# Patient Record
Sex: Male | Born: 1995 | Race: White | Hispanic: No | Marital: Single | Smoking: Never smoker
Health system: Southern US, Community
[De-identification: ages and names within clinical notes are randomized; demographics above are authoritative.]

## PROBLEM LIST (undated history)

## (undated) DIAGNOSIS — F840 Autistic disorder: Secondary | ICD-10-CM

## (undated) DIAGNOSIS — L709 Acne, unspecified: Secondary | ICD-10-CM

## (undated) DIAGNOSIS — Z8489 Family history of other specified conditions: Secondary | ICD-10-CM

## (undated) DIAGNOSIS — F988 Other specified behavioral and emotional disorders with onset usually occurring in childhood and adolescence: Secondary | ICD-10-CM

## (undated) DIAGNOSIS — Z0000 Encounter for general adult medical examination without abnormal findings: Secondary | ICD-10-CM

## (undated) DIAGNOSIS — K219 Gastro-esophageal reflux disease without esophagitis: Secondary | ICD-10-CM

## (undated) HISTORY — PX: ADENOIDECTOMY AND MYRINGOTOMY WITH TUBE PLACEMENT: SHX5714

## (undated) HISTORY — DX: Autistic disorder: F84.0

## (undated) HISTORY — PX: WISDOM TOOTH EXTRACTION: SHX21

## (undated) HISTORY — DX: Encounter for general adult medical examination without abnormal findings: Z00.00

## (undated) HISTORY — DX: Other specified behavioral and emotional disorders with onset usually occurring in childhood and adolescence: F98.8

## (undated) HISTORY — DX: Acne, unspecified: L70.9

---
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
## 2016-06-13 ENCOUNTER — Encounter: Payer: BLUE CROSS/BLUE SHIELD | Admitting: Family Medicine

## 2017-08-14 ENCOUNTER — Encounter: Payer: Self-pay | Admitting: Family Medicine

## 2017-08-14 ENCOUNTER — Ambulatory Visit (INDEPENDENT_AMBULATORY_CARE_PROVIDER_SITE_OTHER): Payer: BLUE CROSS/BLUE SHIELD | Admitting: Family Medicine

## 2017-08-14 DIAGNOSIS — Z0000 Encounter for general adult medical examination without abnormal findings: Secondary | ICD-10-CM | POA: Diagnosis not present

## 2017-08-14 NOTE — Patient Instructions (Signed)
Tylenol  ES 500 mg 1 tab twice daily Aleve/Naproxen or Advil/Motrin/Ibuprofen take 1 daily with food  Hydrate with 64  Preventive Care 18-39 Years, Male Preventive care refers to lifestyle choices and visits with your health care provider that can promote health and wellness. What does preventive care include?  A yearly physical exam. This is also called an annual well check.  Dental exams once or twice a year.  Routine eye exams. Ask your health care provider how often you should have your eyes checked.  Personal lifestyle choices, including: ? Daily care of your teeth and gums. ? Regular physical activity. ? Eating a healthy diet. ? Avoiding tobacco and drug use. ? Limiting alcohol use. ? Practicing safe sex. What happens during an annual well check? The services and screenings done by your health care provider during your annual well check will depend on your age, overall health, lifestyle risk factors, and family history of disease. Counseling Your health care provider may ask you questions about your:  Alcohol use.  Tobacco use.  Drug use.  Emotional well-being.  Home and relationship well-being.  Sexual activity.  Eating habits.  Work and work Statistician.  Screening You may have the following tests or measurements:  Height, weight, and BMI.  Blood pressure.  Lipid and cholesterol levels. These may be checked every 5 years starting at age 34.  Diabetes screening. This is done by checking your blood sugar (glucose) after you have not eaten for a while (fasting).  Skin check.  Hepatitis C blood test.  Hepatitis B blood test.  Sexually transmitted disease (STD) testing.  Discuss your test results, treatment options, and if necessary, the need for more tests with your health care provider. Vaccines Your health care provider may recommend certain vaccines, such as:  Influenza vaccine. This is recommended every year.  Tetanus, diphtheria, and  acellular pertussis (Tdap, Td) vaccine. You may need a Td booster every 10 years.  Varicella vaccine. You may need this if you have not been vaccinated.  HPV vaccine. If you are 37 or younger, you may need three doses over 6 months.  Measles, mumps, and rubella (MMR) vaccine. You may need at least one dose of MMR.You may also need a second dose.  Pneumococcal 13-valent conjugate (PCV13) vaccine. You may need this if you have certain conditions and have not been vaccinated.  Pneumococcal polysaccharide (PPSV23) vaccine. You may need one or two doses if you smoke cigarettes or if you have certain conditions.  Meningococcal vaccine. One dose is recommended if you are age 74-21 years and a first-year college student living in a residence hall, or if you have one of several medical conditions. You may also need additional booster doses.  Hepatitis A vaccine. You may need this if you have certain conditions or if you travel or work in places where you may be exposed to hepatitis A.  Hepatitis B vaccine. You may need this if you have certain conditions or if you travel or work in places where you may be exposed to hepatitis B.  Haemophilus influenzae type b (Hib) vaccine. You may need this if you have certain risk factors.  Talk to your health care provider about which screenings and vaccines you need and how often you need them. This information is not intended to replace advice given to you by your health care provider. Make sure you discuss any questions you have with your health care provider. Document Released: 12/09/2001 Document Revised: 07/02/2016 Document Reviewed: 08/14/2015 Elsevier  Interactive Patient Education  2017 [place]. oz of clear fluids daily

## 2017-08-14 NOTE — Assessment & Plan Note (Addendum)
Patient encouraged to maintain heart healthy diet, regular exercise, adequate sleep. Consider daily probiotics. Take medications as prescribed. 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 Eats well on a good day. Sleeping 7.5 hours most nights and is working well

## 2017-08-14 NOTE — Progress Notes (Signed)
Subjective:  I acted as a Education administrator for Dr. Charlett Blake. [place], [place]  Patient ID: Rickey [name], male    DOB: 07-Apr-1996, 21 y.o.   MRN: [id number]  No chief complaint on file.   HPI  Patient is in today for an annual exam and he reports he feels well. No recent febrile illness or hospitalizations. He continues to work and go to school. Is not always eating a heart healthy diet. Stays active without regular exercise. Doing well with ADLs.  Denies CP/palp/SOB/HA/congestion/fevers/GI or GU c/o. Taking meds as prescribed  Patient Care Team: Mosie Lukes, MD as PCP - General (Family Medicine) Deirdre Pippins, PA-C as Physician Assistant (Internal Medicine)   Past Medical History:  Diagnosis Date  . Acne   . ADD (attention deficit disorder)   . Autism spectrum disorder 06/24/2015  . Autistic disorder   . Preventative health care 06/24/2015    Past Surgical History:  Procedure Laterality Date  . ADENOIDECTOMY AND MYRINGOTOMY WITH TUBE PLACEMENT      Family History  Problem Relation Age of Onset  . Diabetes Father   . Kidney disease Brother        kidney stones  . Diabetes Paternal Grandmother   . Heart disease Paternal Grandmother   . Diabetes Paternal Grandfather   . Heart disease Paternal Grandfather     Social History   Social History  . Marital status: Single    Spouse name: N/A  . Number of children: N/A  . Years of education: N/A   Occupational History  . student at [employer] History Main Topics  . Smoking status: Never Smoker  . Smokeless tobacco: Never Used  . Alcohol use No  . Drug use: No  . Sexual activity: No     Comment: lives with parents and brother,   Other Topics Concern  . Not on file   Social History Narrative  . No narrative on file    Outpatient Medications Prior to Visit  Medication Sig Dispense Refill  . Adapalene-Benzoyl Peroxide (EPIDUO FORTE) 0.3-2.5 % GEL Apply topically at bedtime.    . Naproxen Sodium (ALEVE  PO) Take by mouth as needed.    . sulfamethoxazole-trimethoprim (BACTRIM DS,SEPTRA DS) 800-160 MG per tablet Take 1 tablet by mouth 2 (two) times daily.     No facility-administered medications prior to visit.     No Known Allergies  Review of Systems  Constitutional: Negative for fever and malaise/fatigue.  HENT: Negative for congestion.   Eyes: Negative for blurred vision.  Respiratory: Negative for cough and shortness of breath.   Cardiovascular: Negative for chest pain, palpitations and leg swelling.  Gastrointestinal: Negative for vomiting.  Musculoskeletal: Negative for back pain.  Skin: Negative for rash.  Neurological: Negative for loss of consciousness and headaches.       Objective:    Physical Exam  Constitutional: He is oriented to person, place, and time. He appears well-developed and well-nourished. No distress.  HENT:  Head: Normocephalic and atraumatic.  Eyes: Conjunctivae are normal.  Neck: Normal range of motion. No thyromegaly present.  Cardiovascular: Normal rate and regular rhythm.   Pulmonary/Chest: Effort normal and breath sounds normal. He has no wheezes.  Abdominal: Soft. Bowel sounds are normal. There is no tenderness.  Musculoskeletal: Normal range of motion. He exhibits no edema or deformity.  Neurological: He is alert and oriented to person, place, and time.  Skin: Skin is warm and dry. He is not diaphoretic.  Psychiatric: He has a normal mood and affect.    BP 120/72 (BP Location: Left Arm, Patient Position: Sitting, Cuff Size: Normal)   Pulse 75   Temp 97.9 F (36.6 C) (Oral)   Resp 18   Ht 6' 1.25" (1.861 m)   Wt 175 lb 3.2 oz (79.5 kg)   BMI 22.96 kg/m  Wt Readings from Last 3 Encounters:  08/14/17 175 lb 3.2 oz (79.5 kg)  06/12/15 183 lb 6 oz (83.2 kg) (87 %, Z= 1.14)*   * Growth percentiles are based on CDC 2-20 Years data.   BP Readings from Last 3 Encounters:  08/14/17 120/72  06/12/15 108/68     Immunization History    Administered Date(s) Administered  . Influenza-Unspecified 07/27/2014    Health Maintenance  Topic Date Due  . HIV Screening  02/26/2012  . TETANUS/TDAP  02/26/2016  . INFLUENZA VACCINE  01/24/2018 (Originally 05/27/2017)    No results found for: WBC, HGB, HCT, PLT, GLUCOSE, CHOL, TRIG, HDL, LDLDIRECT, LDLCALC, ALT, AST, NA, K, CL, CREATININE, BUN, CO2, TSH, PSA, INR, GLUF, HGBA1C, MICROALBUR  No results found for: TSH No results found for: WBC, HGB, HCT, MCV, PLT No results found for: NA, K, CHLORIDE, CO2, GLUCOSE, BUN, CREATININE, BILITOT, ALKPHOS, AST, ALT, PROT, ALBUMIN, CALCIUM, ANIONGAP, EGFR, GFR No results found for: CHOL No results found for: HDL No results found for: LDLCALC No results found for: TRIG No results found for: CHOLHDL No results found for: HGBA1C       Assessment & Plan:   Problem List Items Addressed This Visit    Preventative health care    Patient encouraged to maintain heart healthy diet, regular exercise, adequate sleep. Consider daily probiotics. Take medications as prescribed. 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 Eats well on a good day. Sleeping 7.5 hours most nights and is working well         I have discontinued Mr. Fleites's Adapalene-Benzoyl Peroxide, sulfamethoxazole-trimethoprim, and Naproxen Sodium (ALEVE PO).  No orders of the defined types were placed in this encounter.   CMA served as Education administrator during this visit. History, Physical and Plan performed by medical provider. Documentation and orders reviewed and attested to.  Penni Homans, MD

## 2018-08-20 ENCOUNTER — Ambulatory Visit (INDEPENDENT_AMBULATORY_CARE_PROVIDER_SITE_OTHER): Payer: BLUE CROSS/BLUE SHIELD | Admitting: Family Medicine

## 2018-08-20 ENCOUNTER — Encounter: Payer: Self-pay | Admitting: Family Medicine

## 2018-08-20 DIAGNOSIS — Z23 Encounter for immunization: Secondary | ICD-10-CM

## 2018-08-20 DIAGNOSIS — Z0000 Encounter for general adult medical examination without abnormal findings: Secondary | ICD-10-CM

## 2018-08-20 NOTE — Patient Instructions (Signed)
Afrin nasal spray for flying and sleeping   Zyrtec and Flonase and nasal saline as needed  Vitamin C, Elderberry, Zinc for any any respiratory illness.   Lidocaine gel by Aspercreme, Icy Hot, [place]on [place]  Preventive Care 18-39 Years, Male Preventive care refers to lifestyle choices and visits with your health care provider that can promote health and wellness. What does preventive care include?  A yearly physical exam. This is also called an annual well check.  Dental exams once or twice a year.  Routine eye exams. Ask your health care provider how often you should have your eyes checked.  Personal lifestyle choices, including: ? Daily care of your teeth and gums. ? Regular physical activity. ? Eating a healthy diet. ? Avoiding tobacco and drug use. ? Limiting alcohol use. ? Practicing safe sex. What happens during an annual well check? The services and screenings done by your health care provider during your annual well check will depend on your age, overall health, lifestyle risk factors, and family history of disease. Counseling Your health care provider may ask you questions about your:  Alcohol use.  Tobacco use.  Drug use.  Emotional well-being.  Home and relationship well-being.  Sexual activity.  Eating habits.  Work and work Statistician.  Screening You may have the following tests or measurements:  Height, weight, and BMI.  Blood pressure.  Lipid and cholesterol levels. These may be checked every 5 years starting at age 98.  Diabetes screening. This is done by checking your blood sugar (glucose) after you have not eaten for a while (fasting).  Skin check.  Hepatitis C blood test.  Hepatitis B blood test.  Sexually transmitted disease (STD) testing.  Discuss your test results, treatment options, and if necessary, the need for more tests with your health care provider. Vaccines Your health care provider may recommend certain vaccines, such  as:  Influenza vaccine. This is recommended every year.  Tetanus, diphtheria, and acellular pertussis (Tdap, Td) vaccine. You may need a Td booster every 10 years.  Varicella vaccine. You may need this if you have not been vaccinated.  HPV vaccine. If you are 43 or younger, you may need three doses over 6 months.  Measles, mumps, and rubella (MMR) vaccine. You may need at least one dose of MMR.You may also need a second dose.  Pneumococcal 13-valent conjugate (PCV13) vaccine. You may need this if you have certain conditions and have not been vaccinated.  Pneumococcal polysaccharide (PPSV23) vaccine. You may need one or two doses if you smoke cigarettes or if you have certain conditions.  Meningococcal vaccine. One dose is recommended if you are age 98-21 years and a first-year college student living in a residence hall, or if you have one of several medical conditions. You may also need additional booster doses.  Hepatitis A vaccine. You may need this if you have certain conditions or if you travel or work in places where you may be exposed to hepatitis A.  Hepatitis B vaccine. You may need this if you have certain conditions or if you travel or work in places where you may be exposed to hepatitis B.  Haemophilus influenzae type b (Hib) vaccine. You may need this if you have certain risk factors.  Talk to your health care provider about which screenings and vaccines you need and how often you need them. This information is not intended to replace advice given to you by your health care provider. Make sure you discuss any questions you  have with your health care provider. Document Released: 12/09/2001 Document Revised: 07/02/2016 Document Reviewed: 08/14/2015 Elsevier Interactive Patient Education  2018 [place] 18-39 Years, Male Preventive care refers to lifestyle choices and visits with your health care provider that can promote health and wellness. What  does preventive care include?  A yearly physical exam. This is also called an annual well check.  Dental exams once or twice a year.  Routine eye exams. Ask your health care provider how often you should have your eyes checked.  Personal lifestyle choices, including: ? Daily care of your teeth and gums. ? Regular physical activity. ? Eating a healthy diet. ? Avoiding tobacco and drug use. ? Limiting alcohol use. ? Practicing safe sex. What happens during an annual well check? The services and screenings done by your health care provider during your annual well check will depend on your age, overall health, lifestyle risk factors, and family history of disease. Counseling Your health care provider may ask you questions about your:  Alcohol use.  Tobacco use.  Drug use.  Emotional well-being.  Home and relationship well-being.  Sexual activity.  Eating habits.  Work and work Statistician.  Screening You may have the following tests or measurements:  Height, weight, and BMI.  Blood pressure.  Lipid and cholesterol levels. These may be checked every 5 years starting at age 22.  Diabetes screening. This is done by checking your blood sugar (glucose) after you have not eaten for a while (fasting).  Skin check.  Hepatitis C blood test.  Hepatitis B blood test.  Sexually transmitted disease (STD) testing.  Discuss your test results, treatment options, and if necessary, the need for more tests with your health care provider. Vaccines Your health care provider may recommend certain vaccines, such as:  Influenza vaccine. This is recommended every year.  Tetanus, diphtheria, and acellular pertussis (Tdap, Td) vaccine. You may need a Td booster every 10 years.  Varicella vaccine. You may need this if you have not been vaccinated.  HPV vaccine. If you are 63 or younger, you may need three doses over 6 months.  Measles, mumps, and rubella (MMR) vaccine. You may need  at least one dose of MMR.You may also need a second dose.  Pneumococcal 13-valent conjugate (PCV13) vaccine. You may need this if you have certain conditions and have not been vaccinated.  Pneumococcal polysaccharide (PPSV23) vaccine. You may need one or two doses if you smoke cigarettes or if you have certain conditions.  Meningococcal vaccine. One dose is recommended if you are age 16-21 years and a first-year college student living in a residence hall, or if you have one of several medical conditions. You may also need additional booster doses.  Hepatitis A vaccine. You may need this if you have certain conditions or if you travel or work in places where you may be exposed to hepatitis A.  Hepatitis B vaccine. You may need this if you have certain conditions or if you travel or work in places where you may be exposed to hepatitis B.  Haemophilus influenzae type b (Hib) vaccine. You may need this if you have certain risk factors.  Talk to your health care provider about which screenings and vaccines you need and how often you need them. This information is not intended to replace advice given to you by your health care provider. Make sure you discuss any questions you have with your health care provider. Document Released: 12/09/2001 Document Revised:  07/02/2016 Document Reviewed: 08/14/2015 Elsevier Interactive Patient Education  [employer].

## 2018-08-20 NOTE — Assessment & Plan Note (Signed)
Patient encouraged to maintain heart healthy diet, regular exercise, adequate sleep. Consider daily probiotics. Take medications as prescribed. Given and reviewed copy of ACP documents from [place] Secretary of State and encouraged to complete and return 

## 2018-08-20 NOTE — Progress Notes (Signed)
Subjective:    Patient ID: Rickey [name], male    DOB: April 25, 1996, 22 y.o.   MRN: [id number]  No chief complaint on file.   HPI Patient is in today for annual preventative exam. He feels well today. No recent febrile illness or hospitalizations. He is eating well and trying to maintain a heart healthy diet. He stays active and works full time while going to school. No difficulties with activities of daily living. Denies CP/palp/SOB/HA/congestion/fevers/GI or GU c/o. Taking meds as prescribed  Past Medical History:  Diagnosis Date  . Acne   . ADD (attention deficit disorder)   . Autism spectrum disorder 06/24/2015  . Autistic disorder   . Preventative health care 06/24/2015    Past Surgical History:  Procedure Laterality Date  . ADENOIDECTOMY AND MYRINGOTOMY WITH TUBE PLACEMENT      Family History  Problem Relation Age of Onset  . Diabetes Father   . Kidney disease Brother        kidney stones  . Diabetes Paternal Grandmother   . Heart disease Paternal Grandmother   . Diabetes Paternal Grandfather   . Heart disease Paternal Grandfather     Social History   Socioeconomic History  . Marital status: Single    Spouse name: Not on file  . Number of children: Not on file  . Years of education: Not on file  . Highest education level: Not on file  Occupational History  . Occupation: Ship broker at [employer]  . Financial resource strain: Not on file  . Food insecurity:    Worry: Not on file    Inability: Not on file  . Transportation needs:    Medical: Not on file    Non-medical: Not on file  Tobacco Use  . Smoking status: Never Smoker  . Smokeless tobacco: Never Used  Substance and Sexual Activity  . Alcohol use: No    Alcohol/week: 0.0 standard drinks  . Drug use: No  . Sexual activity: Never    Comment: lives with parents and brother,  Lifestyle  . Physical activity:    Days per week: Not on file    Minutes per session: Not on file  .  Stress: Not on file  Relationships  . Social connections:    Talks on phone: Not on file    Gets together: Not on file    Attends religious service: Not on file    Active member of club or organization: Not on file    Attends meetings of clubs or organizations: Not on file    Relationship status: Not on file  . Intimate partner violence:    Fear of current or ex partner: Not on file    Emotionally abused: Not on file    Physically abused: Not on file    Forced sexual activity: Not on file  Other Topics Concern  . Not on file  Social History Narrative  . Not on file    No outpatient medications prior to visit.   No facility-administered medications prior to visit.     No Known Allergies  Review of Systems  Constitutional: Negative for chills, fever and malaise/fatigue.  HENT: Negative for congestion and hearing loss.   Eyes: Negative for discharge.  Respiratory: Negative for cough, sputum production and shortness of breath.   Cardiovascular: Negative for chest pain, palpitations and leg swelling.  Gastrointestinal: Negative for abdominal pain, blood in stool, constipation, diarrhea, heartburn, nausea and vomiting.  Genitourinary: Negative for dysuria,  frequency, hematuria and urgency.  Musculoskeletal: Negative for back pain, falls and myalgias.  Skin: Negative for rash.  Neurological: Negative for dizziness, sensory change, loss of consciousness, weakness and headaches.  Endo/Heme/Allergies: Negative for environmental allergies. Does not bruise/bleed easily.  Psychiatric/Behavioral: Negative for depression and suicidal ideas. The patient is not nervous/anxious and does not have insomnia.        Objective:    Physical Exam  Constitutional: He is oriented to person, place, and time. He appears well-developed and well-nourished. No distress.  HENT:  Head: Normocephalic and atraumatic.  Nose: Nose normal.  Eyes: Right eye exhibits no discharge. Left eye exhibits no  discharge.  Neck: Normal range of motion. Neck supple.  Cardiovascular: Normal rate and regular rhythm.  No murmur heard. Pulmonary/Chest: Effort normal and breath sounds normal.  Abdominal: Soft. Bowel sounds are normal. There is no tenderness.  Musculoskeletal: He exhibits no edema.  Neurological: He is alert and oriented to person, place, and time.  Skin: Skin is warm and dry.  Psychiatric: He has a normal mood and affect.  Nursing note and vitals reviewed.   There were no vitals taken for this visit. Wt Readings from Last 3 Encounters:  08/14/17 175 lb 3.2 oz (79.5 kg)  06/12/15 183 lb 6 oz (83.2 kg) (87 %, Z= 1.14)*   * Growth percentiles are based on CDC (Boys, 2-20 Years) data.     No results found for: WBC, HGB, HCT, PLT, GLUCOSE, CHOL, TRIG, HDL, LDLDIRECT, LDLCALC, ALT, AST, NA, K, CL, CREATININE, BUN, CO2, TSH, PSA, INR, GLUF, HGBA1C, MICROALBUR  No results found for: TSH No results found for: WBC, HGB, HCT, MCV, PLT No results found for: NA, K, CHLORIDE, CO2, GLUCOSE, BUN, CREATININE, BILITOT, ALKPHOS, AST, ALT, PROT, ALBUMIN, CALCIUM, ANIONGAP, EGFR, GFR No results found for: CHOL No results found for: HDL No results found for: LDLCALC No results found for: TRIG No results found for: CHOLHDL No results found for: HGBA1C     Assessment & Plan:   Problem List Items Addressed This Visit    Preventative health care    Patient encouraged to maintain heart healthy diet, regular exercise, adequate sleep. Consider daily probiotics. Take medications as prescribed. Given and reviewed copy of ACP documents from [employer] and encouraged to complete and return.         Bennie Dallas does not currently have medications on file.  No orders of the defined types were placed in this encounter.    Penni Homans, MD

## 2019-08-25 ENCOUNTER — Encounter: Payer: BLUE CROSS/BLUE SHIELD | Admitting: Family Medicine

## 2020-08-05 DIAGNOSIS — K219 Gastro-esophageal reflux disease without esophagitis: Secondary | ICD-10-CM | POA: Diagnosis not present

## 2020-08-13 ENCOUNTER — Other Ambulatory Visit: Payer: Self-pay

## 2020-08-13 ENCOUNTER — Ambulatory Visit (INDEPENDENT_AMBULATORY_CARE_PROVIDER_SITE_OTHER): Payer: BC Managed Care – PPO | Admitting: Medical

## 2020-08-13 VITALS — BP 150/92 | HR 110 | Temp 98.5°F | Resp 20 | Ht 72.0 in | Wt 205.6 lb

## 2020-08-13 DIAGNOSIS — M549 Dorsalgia, unspecified: Secondary | ICD-10-CM | POA: Diagnosis not present

## 2020-08-13 DIAGNOSIS — K219 Gastro-esophageal reflux disease without esophagitis: Secondary | ICD-10-CM

## 2020-08-13 DIAGNOSIS — R1011 Right upper quadrant pain: Secondary | ICD-10-CM

## 2020-08-13 LAB — POC URINALSYSI DIPSTICK (AUTOMATED)
Bilirubin, UA: NEGATIVE
Blood, UA: NEGATIVE
Glucose, UA: NEGATIVE
Ketones, UA: 5
Leukocytes, UA: NEGATIVE
Nitrite, UA: NEGATIVE
Protein, UA: POSITIVE — AB
Spec Grav, UA: 1.015 (ref 1.010–1.025)
Urobilinogen, UA: 0.2 E.U./dL
pH, UA: 7 (ref 5.0–8.0)

## 2020-08-13 LAB — CBC WITH DIFFERENTIAL/PLATELET
Absolute Monocytes: 540 cells/uL (ref 200–950)
Platelets: 224 10*3/uL (ref 140–400)
RBC: 5.46 10*6/uL (ref 4.20–5.80)

## 2020-08-13 NOTE — Patient Instructions (Addendum)
For your recent gerd/heart burn continue omeprazole and healthy diet. Avoid fried foods and greasy foods. Stop cheer wine and avoid alcohol.  Get labs today cbc, cmp and lipase.  Go down stairs and get scheduled for [place] of abdomen.   Urine does not show blood or infection. So I am not doing imaging studies/not xray needed presently.  If pain recurrent in back along with upper rt side abdomen pain let us know.  Follow up in 7 days or as needed.

## 2020-08-13 NOTE — Progress Notes (Signed)
Subjective:    Patient ID: Rickey [name], male    DOB: 1996-05-20, 24 y.o.   MRN: [id number]  HPI  Pt states last week Saturday had upset stomach all weekend.   Before that he was reporting upset stomach about once a month for past 4 months.   Pt admits eating fried foods and greasy foods. Admits this is common in his diet. He does drink a lot of 3 cheer wine a day. Drinks alcohol about once a month.  Weekend he went to the ED did vomit once.  When he layed down at night noted very bitter taste and then later sour taste.  Pt given omeprazole 20 mg daily. Pt states it has helped. This stopped epigastric pain.   But then states last night he had some pain in upper rt side abdomen pain which he describes going to his back. Now this pain gone since this morning.     Review of Systems  Constitutional: Negative for chills and fatigue.  HENT: Negative for congestion.   Respiratory: Negative for cough, chest tightness and wheezing.   Cardiovascular: Negative for chest pain and palpitations.  Gastrointestinal: Positive for abdominal pain.  Genitourinary: Negative for dysuria, flank pain, frequency and hematuria.  Musculoskeletal: Positive for back pain.  Skin: Negative for rash.  Neurological: Negative for dizziness, speech difficulty, weakness and light-headedness.  Hematological: Negative for adenopathy. Does not bruise/bleed easily.  Psychiatric/Behavioral: Negative for behavioral problems and decreased concentration.    Past Medical History:  Diagnosis Date  . Acne   . ADD (attention deficit disorder)   . Autism spectrum disorder 06/24/2015  . Autistic disorder   . Preventative health care 06/24/2015     Social History   Socioeconomic History  . Marital status: Single    Spouse name: Not on file  . Number of children: Not on file  . Years of education: Not on file  . Highest education level: Not on file  Occupational History  . Occupation: Consulting civil engineer at [employer]  . Smoking status: Never Smoker  . Smokeless tobacco: Never Used  Vaping Use  . Vaping Use: Never used  Substance and Sexual Activity  . Alcohol use: No    Alcohol/week: 0.0 standard drinks  . Drug use: No  . Sexual activity: Never    Comment: lives with parents and brother,  Other Topics Concern  . Not on file  Social History Narrative  . Not on file   Social Determinants of Health   Financial Resource Strain:   . Difficulty of Paying Living Expenses: Not on file  Food Insecurity:   . Worried About Programme researcher, broadcasting/film/video in the Last Year: Not on file  . Ran Out of Food in the Last Year: Not on file  Transportation Needs:   . Lack of Transportation (Medical): Not on file  . Lack of Transportation (Non-Medical): Not on file  Physical Activity:   . Days of Exercise per Week: Not on file  . Minutes of Exercise per Session: Not on file  Stress:   . Feeling of Stress : Not on file  Social Connections:   . Frequency of Communication with Friends and Family: Not on file  . Frequency of Social Gatherings with Friends and Family: Not on file  . Attends Religious Services: Not on file  . Active Member of Clubs or Organizations: Not on file  . Attends Banker Meetings: Not on file  . Marital Status: Not  on file  Intimate Partner Violence:   . Fear of Current or Ex-Partner: Not on file  . Emotionally Abused: Not on file  . Physically Abused: Not on file  . Sexually Abused: Not on file    Past Surgical History:  Procedure Laterality Date  . ADENOIDECTOMY AND MYRINGOTOMY WITH TUBE PLACEMENT      Family History  Problem Relation Age of Onset  . Diabetes Father   . Kidney disease Brother        kidney stones  . Diabetes Paternal Grandmother   . Heart disease Paternal Grandmother   . Diabetes Paternal Grandfather   . Heart disease Paternal Grandfather     No Known Allergies  Current Outpatient Medications on File Prior to Visit  Medication  Sig Dispense Refill  . omeprazole (PRILOSEC) 20 MG capsule Take by mouth.     No current facility-administered medications on file prior to visit.    BP (!) 150/92   Pulse (!) 110   Temp 98.5 F (36.9 C) (Oral)   Resp 20   Ht 6' (1.829 m)   Wt 205 lb 9.6 oz (93.3 kg)   SpO2 94%   BMI 27.88 kg/m       Objective:   Physical Exam General Mental Status- Alert. General Appearance- Not in acute distress.   Skin General: Color- Normal Color. Moisture- Normal Moisture. No lesions or vesicle rt flank area.  Neck Carotid Arteries- Normal color. Moisture- Normal Moisture. No carotid bruits. No JVD.  Chest and Lung Exam Auscultation: Breath Sounds:-Normal.  Cardiovascular Auscultation:Rythm- Regular. Murmurs & Other Heart Sounds:Auscultation of the heart reveals- No Murmurs.  Abdomen Inspection:-Inspeection Normal. Palpation/Percussion:Note:No mass. Palpation and Percussion of the abdomen reveal- mild rt upper quadrant Tender, Non Distended + BS, no rebound or guarding.  Back- no cva tenderness.  Neurologic Cranial Nerve exam:- CN III-XII intact(No nystagmus), symmetric smile. Strength:- 5/5 equal and symmetric strength both upper and lower extremities.      Assessment & Plan:  For your recent gerd/heart burn continue omeprazole and healthy diet. Avoid fried foods and greasy foods. Stop cheer wine and avoid alcohol.  Get labs today cbc, cmp and lipase.  Go down stairs and get scheduled for [place] of abdomen.   Urine does not show blood or infection. So I am not doing imaging studies/not xray needed presently.  If pain recurrent in back along with upper rt side abdomen pain let us know.  Follow up in 7 days or as needed.

## 2020-08-14 LAB — CBC WITH DIFFERENTIAL/PLATELET
Basophils Absolute: 39 cells/uL (ref 0–200)
Basophils Relative: 0.6 %
Eosinophils Absolute: 59 cells/uL (ref 15–500)
Eosinophils Relative: 0.9 %
HCT: 47.4 % (ref 38.5–50.0)
Hemoglobin: 16.2 g/dL (ref 13.2–17.1)
Lymphs Abs: 1105 cells/uL (ref 850–3900)
MCH: 29.7 pg (ref 27.0–33.0)
MCHC: 34.2 g/dL (ref 32.0–36.0)
MCV: 86.8 fL (ref 80.0–100.0)
MPV: 12.3 fL (ref 7.5–12.5)
Monocytes Relative: 8.3 %
Neutro Abs: 4758 cells/uL (ref 1500–7800)
Neutrophils Relative %: 73.2 %
RDW: 12.6 % (ref 11.0–15.0)
Total Lymphocyte: 17 %
WBC: 6.5 10*3/uL (ref 3.8–10.8)

## 2020-08-14 LAB — COMPREHENSIVE METABOLIC PANEL
AG Ratio: 1.8 (calc) (ref 1.0–2.5)
ALT: 40 U/L (ref 9–46)
AST: 24 U/L (ref 10–40)
Albumin: 4.6 g/dL (ref 3.6–5.1)
Alkaline phosphatase (APISO): 85 U/L (ref 36–130)
BUN: 11 mg/dL (ref 7–25)
CO2: 26 mmol/L (ref 20–32)
Calcium: 9.8 mg/dL (ref 8.6–10.3)
Chloride: 104 mmol/L (ref 98–110)
Creat: 0.92 mg/dL (ref 0.60–1.35)
Globulin: 2.6 g/dL (calc) (ref 1.9–3.7)
Glucose, Bld: 113 mg/dL — ABNORMAL HIGH (ref 65–99)
Potassium: 3.5 mmol/L (ref 3.5–5.3)
Sodium: 142 mmol/L (ref 135–146)
Total Bilirubin: 1 mg/dL (ref 0.2–1.2)
Total Protein: 7.2 g/dL (ref 6.1–8.1)

## 2020-08-14 LAB — LIPASE: Lipase: 59 U/L (ref 7–60)

## 2020-08-16 ENCOUNTER — Ambulatory Visit (HOSPITAL_BASED_OUTPATIENT_CLINIC_OR_DEPARTMENT_OTHER): Payer: BC Managed Care – PPO

## 2020-08-16 ENCOUNTER — Encounter: Payer: Self-pay | Admitting: Medical

## 2020-08-18 ENCOUNTER — Other Ambulatory Visit: Payer: Self-pay

## 2020-08-18 ENCOUNTER — Telehealth: Payer: Self-pay | Admitting: Medical

## 2020-08-18 ENCOUNTER — Ambulatory Visit (HOSPITAL_BASED_OUTPATIENT_CLINIC_OR_DEPARTMENT_OTHER)
Admission: RE | Admit: 2020-08-18 | Discharge: 2020-08-18 | Disposition: A | Discharge status: Home / Self Care | Payer: BC Managed Care – PPO | Source: Ambulatory Visit | Attending: Medical | Admitting: Medical

## 2020-08-18 DIAGNOSIS — R1011 Right upper quadrant pain: Secondary | ICD-10-CM | POA: Insufficient documentation

## 2020-08-18 DIAGNOSIS — K8020 Calculus of gallbladder without cholecystitis without obstruction: Secondary | ICD-10-CM | POA: Diagnosis not present

## 2020-08-18 DIAGNOSIS — K8021 Calculus of gallbladder without cholecystitis with obstruction: Secondary | ICD-10-CM

## 2020-08-18 NOTE — Telephone Encounter (Signed)
Referral to general surgeon placed. 

## 2020-08-20 ENCOUNTER — Ambulatory Visit (HOSPITAL_BASED_OUTPATIENT_CLINIC_OR_DEPARTMENT_OTHER): Payer: BC Managed Care – PPO

## 2020-08-20 ENCOUNTER — Ambulatory Visit (INDEPENDENT_AMBULATORY_CARE_PROVIDER_SITE_OTHER): Payer: BC Managed Care – PPO | Admitting: Medical

## 2020-08-20 ENCOUNTER — Other Ambulatory Visit: Payer: Self-pay

## 2020-08-20 VITALS — BP 144/77 | HR 96 | Temp 98.5°F | Resp 18 | Ht 72.0 in | Wt 204.0 lb

## 2020-08-20 DIAGNOSIS — K8021 Calculus of gallbladder without cholecystitis with obstruction: Secondary | ICD-10-CM | POA: Diagnosis not present

## 2020-08-20 DIAGNOSIS — K219 Gastro-esophageal reflux disease without esophagitis: Secondary | ICD-10-CM

## 2020-08-20 DIAGNOSIS — K649 Unspecified hemorrhoids: Secondary | ICD-10-CM | POA: Diagnosis not present

## 2020-08-20 MED ORDER — OMEPRAZOLE 20 MG PO CPDR: 20.0000 mg | DELAYED_RELEASE_CAPSULE | Freq: Every day | ORAL | 0 refills | Status: DC

## 2020-08-20 MED ORDER — HYDROCORTISONE ACETATE 25 MG RE SUPP: 25.0000 mg | Freq: Two times a day (BID) | RECTAL | 0 refills | Status: DC

## 2020-08-20 NOTE — Patient Instructions (Signed)
You do have recent reflux type symptoms and have improved with the use of omeprazole and modified diet.  However work-up did show that you have 1.7cm gallstone with some mild sludge present.  Recommended to continue omeprazole and continue healthy diet guidelines advised on last visit.  Did go ahead and place referral to general surgeon.    Also he has some recent mild bright red blood when he had BM earlier today.  On exam you appear to have 2 small hemorrhoids that do not look thrombosed.  Recommend that you stay well-hydrated and eat healthy diet with adequate fiber.  Avoid constipation.  Will prescribe Anusol HC suppositories.  This test not covered by your insurance.  Advise getting over-the-counter RectiCare.  If you have recurrent bright red blood in stools despite these measures let me know and in that event would refer you to gastroenterologist.  You report having lump in the right upper quadrant region.  However on exam I do not feel any obvious abnormality.  Her general surgeon will fill this area as well.  Ultrasound did not show any abnormality other than the gallstones.  Follow-up in 3 to 4 weeks or as needed.

## 2020-08-20 NOTE — Progress Notes (Signed)
Subjective:    Patient ID: Rickey [name], male    DOB: 1995-12-01, 24 y.o.   MRN: [id number]  HPI  Pt in for follow up.  Pt has some recent epigastric pain and some rt upper quadrant pain. Pt states he thought medication has been helping. Since starting omeprazole the epigastric pain is less.  Below is the [place] report. IMPRESSION: Cholelithiasis(size 1.7 cmm) and sludge is noted without evidence of cholecystitis. No other abnormality seen in the abdomen.   Pt notes some bright red blood in toilet water. Just saw randomly. Some rectal itching recently/before he went to bathroom. No obvious constipation.      Review of Systems  Constitutional: Negative for chills and fatigue.  Respiratory: Negative for cough, chest tightness, shortness of breath, wheezing and stridor.   Cardiovascular: Negative for chest pain and palpitations.  Gastrointestinal: Negative for abdominal pain, constipation, diarrhea and vomiting.       See bright red blood.  Musculoskeletal: Negative for back pain.  Skin: Negative for rash.  Hematological: Negative for adenopathy. Does not bruise/bleed easily.  Psychiatric/Behavioral: Negative for behavioral problems, confusion and dysphoric mood. The patient is not nervous/anxious and is not hyperactive.     Past Medical History:  Diagnosis Date  . Acne   . ADD (attention deficit disorder)   . Autism spectrum disorder 06/24/2015  . Autistic disorder   . Preventative health care 06/24/2015     Social History   Socioeconomic History  . Marital status: Single    Spouse name: Not on file  . Number of children: Not on file  . Years of education: Not on file  . Highest education level: Not on file  Occupational History  . Occupation: Consulting civil engineer at [employer]  . Smoking status: Never Smoker  . Smokeless tobacco: Never Used  Vaping Use  . Vaping Use: Never used  Substance and Sexual Activity  . Alcohol use: No    Alcohol/week: 0.0  standard drinks  . Drug use: No  . Sexual activity: Never    Comment: lives with parents and brother,  Other Topics Concern  . Not on file  Social History Narrative  . Not on file   Social Determinants of Health   Financial Resource Strain:   . Difficulty of Paying Living Expenses: Not on file  Food Insecurity:   . Worried About Programme researcher, broadcasting/film/video in the Last Year: Not on file  . Ran Out of Food in the Last Year: Not on file  Transportation Needs:   . Lack of Transportation (Medical): Not on file  . Lack of Transportation (Non-Medical): Not on file  Physical Activity:   . Days of Exercise per Week: Not on file  . Minutes of Exercise per Session: Not on file  Stress:   . Feeling of Stress : Not on file  Social Connections:   . Frequency of Communication with Friends and Family: Not on file  . Frequency of Social Gatherings with Friends and Family: Not on file  . Attends Religious Services: Not on file  . Active Member of Clubs or Organizations: Not on file  . Attends Banker Meetings: Not on file  . Marital Status: Not on file  Intimate Partner Violence:   . Fear of Current or Ex-Partner: Not on file  . Emotionally Abused: Not on file  . Physically Abused: Not on file  . Sexually Abused: Not on file    Past Surgical History:  Procedure  Laterality Date  . ADENOIDECTOMY AND MYRINGOTOMY WITH TUBE PLACEMENT      Family History  Problem Relation Age of Onset  . Diabetes Father   . Kidney disease Brother        kidney stones  . Diabetes Paternal Grandmother   . Heart disease Paternal Grandmother   . Diabetes Paternal Grandfather   . Heart disease Paternal Grandfather     No Known Allergies  Current Outpatient Medications on File Prior to Visit  Medication Sig Dispense Refill  . omeprazole (PRILOSEC) 20 MG capsule Take by mouth.     No current facility-administered medications on file prior to visit.    BP (!) 144/77   Pulse 96   Temp 98.5 F  (36.9 C) (Oral)   Resp 18   Ht 6' (1.829 m)   Wt 204 lb (92.5 kg)   SpO2 100%   BMI 27.67 kg/m       Objective:   Physical Exam  General Mental Status- Alert. General Appearance- Not in acute distress.   Skin General: Color- Normal Color. Moisture- Normal Moisture. No lesions or vesicle rt flank area.  Neck Carotid Arteries- Normal color. Moisture- Normal Moisture. No carotid bruits. No JVD.  Chest and Lung Exam Auscultation: Breath Sounds:-Normal.  Cardiovascular Auscultation:Rythm- Regular. Murmurs & Other Heart Sounds:Auscultation of the heart reveals- No Murmurs.  Abdomen Inspection:-Inspeection Normal. Palpation/Percussion:Note:No mass. Palpation and Percussion of the abdomen reveal- mild rt upper quadrant Tender, Non Distended + BS, no rebound or guarding.  Back- no cva tenderness.  Neurologic Cranial Nerve exam:- CN III-XII intact(No nystagmus), symmetric smile. Strength:- 5/5 equal and symmetric strength both upper and lower extremities.      Assessment & Plan:  You do have recent reflux type symptoms and have improved with the use of omeprazole and modified diet.  However work-up did show that you have 1.7cm gallstone with some mild sludge present.  Recommended to continue omeprazole and continue healthy diet guidelines advised on last visit.  Did go ahead and place referral to general surgeon.    Also he has some recent mild bright red blood when he had BM earlier today.  On exam you appear to have 2 small hemorrhoids that do not look thrombosed.  Recommend that you stay well-hydrated and eat healthy diet with adequate fiber.  Avoid constipation.  Will prescribe Anusol HC suppositories.  This test not covered by your insurance.  Advise getting over-the-counter RectiCare.  If you have recurrent bright red blood in stools despite these measures let me know and in that event would refer you to gastroenterologist.  You report having lump in the right upper  quadrant region.  However on exam I do not feel any obvious abnormality.  Her general surgeon will fill this area as well.  Ultrasound did not show any abnormality other than the gallstones.  Follow-up in 3 to 4 weeks or as needed.  Time spent with patient today was 31  minutes which consisted of chart review, discussing diagnoses, work up, treatment, answering all of patient's questions and documentation.

## 2020-09-03 ENCOUNTER — Telehealth: Payer: Self-pay | Admitting: Medical

## 2020-09-03 MED ORDER — OMEPRAZOLE 20 MG PO CPDR: DELAYED_RELEASE_CAPSULE | ORAL | 1 refills | Status: DC

## 2020-09-03 NOTE — Telephone Encounter (Signed)
Rx omeprazole sent to pt pharmacy. 

## 2020-10-02 ENCOUNTER — Ambulatory Visit: Payer: Self-pay | Admitting: Surgery

## 2020-10-02 DIAGNOSIS — K8020 Calculus of gallbladder without cholecystitis without obstruction: Secondary | ICD-10-CM | POA: Diagnosis not present

## 2020-10-02 NOTE — H&P (Signed)
Rickey [name] Appointment: 10/02/2020 9:40 AM Location: [facility] Patient #: [id number] DOB: 29-Aug-1996 Single / Language: [place] / Race: White Male  History of Present Illness Maisie Fus A. Dereka Lueras MD; 10/02/2020 11:10 AM) Patient words: Patient sent at the request of Thersa Salt PA for evaluation of epigastric abdominal pain 2-3 months. Patient presents with a history of intermittent sharp epigastric pain after eating. Location is in the epigastrium and right upper quadrant. The pain last minutes to up to an hour. It is associated with nausea and vomiting. It was episodic occurring most times a month. No specific foods trigger. He does have bilious emesis and still we states. He was seen by his primary care physician who ordered an ultrasound which shows gallstones without gallbladder wall thickening. Common bile duct is 4 mm and liver function studies are within normal limits.     CLINICAL DATA: Epigastric abdominal pain.  EXAM: ABDOMEN ULTRASOUND COMPLETE  COMPARISON: None.  FINDINGS: Gallbladder: 1.7 cm gallstone is noted. No gallbladder wall thickening or pericholecystic fluid is noted. Mild amount of sludge is noted within gallbladder lumen. No sonographic Murphy's sign is noted.  Common bile duct: Diameter: 4 mm which is within normal limits.  Liver: No focal lesion identified. Within normal limits in parenchymal echogenicity. Portal vein is patent on color Doppler imaging with normal direction of blood flow towards the liver.  IVC: No abnormality visualized.  Pancreas: Visualized portion unremarkable.  Spleen: Size and appearance within normal limits.  Right Kidney: Length: 10.8 cm. Echogenicity within normal limits. No mass or hydronephrosis visualized.  Left Kidney: Length: 10.7 cm. Echogenicity within normal limits. No mass or hydronephrosis visualized.  Abdominal aorta: No aneurysm visualized.  Other findings:  None.  IMPRESSION: Cholelithiasis and sludge is noted without evidence of cholecystitis. No other abnormality seen in the abdomen.   Electronically Signed By: Lupita Raider M.D. On: 08/18/2020 10:19.  The patient is a 24 year old male.   Past Surgical History (Tanisha A. Manson Passey, RMA; 10/02/2020 9:41 AM) Oral Surgery  Diagnostic Studies History (Tanisha A. Manson Passey, RMA; 10/02/2020 9:41 AM) Colonoscopy never  Allergies (Tanisha A. Manson Passey, RMA; 10/02/2020 9:41 AM) No Known Drug Allergies [10/02/2020]: Allergies Reconciled  Medication History (Tanisha A. Manson Passey, RMA; 10/02/2020 9:41 AM) Omeprazole (20MG  Capsule DR, Oral) Active. Medications Reconciled  Social History (Tanisha A. , RMA; 10/02/2020 9:41 AM) Alcohol use Occasional alcohol use. Caffeine use Carbonated beverages, Coffee, Tea. No drug use Tobacco use Never smoker.  Family History (Tanisha A. 14/04/2020, RMA; 10/02/2020 9:41 AM) Diabetes Mellitus Father.  Other Problems (Tanisha A. 14/04/2020, RMA; 10/02/2020 9:41 AM) Gastroesophageal Reflux Disease     Review of Systems (Tanisha A. Brown RMA; 10/02/2020 9:41 AM) General Present- Appetite Loss. Not Present- Chills, Fatigue, Fever, Night Sweats, Weight Gain and Weight Loss. Skin Not Present- Change in Wart/Mole, Dryness, Hives, Jaundice, New Lesions, Non-Healing Wounds, Rash and Ulcer. HEENT Present- Seasonal Allergies. Not Present- Earache, Hearing Loss, Hoarseness, Nose Bleed, Oral Ulcers, Ringing in the Ears, Sinus Pain, Sore Throat, Visual Disturbances, Wears glasses/contact lenses and Yellow Eyes. Respiratory Not Present- Bloody sputum, Chronic Cough, Difficulty Breathing, Snoring and Wheezing. Breast Not Present- Breast Mass, Breast Pain, Nipple Discharge and Skin Changes. Cardiovascular Present- Difficulty Breathing Lying Down and Leg Cramps. Not Present- Chest Pain, Palpitations, Rapid Heart Rate, Shortness of Breath and Swelling of  Extremities. Gastrointestinal Present- Abdominal Pain, Change in Bowel Habits, Chronic diarrhea, Constipation, Excessive gas, Hemorrhoids, Indigestion and Rectal Pain. Not Present- Bloating, Bloody Stool, Difficulty Swallowing, Gets full  quickly at meals, Nausea and Vomiting. Male Genitourinary Not Present- Blood in Urine, Change in Urinary Stream, Frequency, Impotence, Nocturia, Painful Urination, Urgency and Urine Leakage. Musculoskeletal Present- Back Pain and Muscle Pain. Not Present- Joint Pain, Joint Stiffness, Muscle Weakness and Swelling of Extremities. Neurological Not Present- Decreased Memory, Fainting, Headaches, Numbness, Seizures, Tingling, Tremor, Trouble walking and Weakness. Psychiatric Not Present- Anxiety, Bipolar, Change in Sleep Pattern, Depression, Fearful and Frequent crying. Endocrine Not Present- Cold Intolerance, Excessive Hunger, Hair Changes, Heat Intolerance, Hot flashes and New Diabetes. Hematology Not Present- Blood Thinners, Easy Bruising, Excessive bleeding, Gland problems, HIV and Persistent Infections.  Vitals (Tanisha A. Brown RMA; 10/02/2020 9:42 AM) 10/02/2020 9:41 AM Weight: 201.6 lb Height: 74in Body Surface Area: 2.18 m Body Mass Index: 25.88 kg/m  Temp.: 98.36F  Pulse: 103 (Regular)  BP: 118/84(Sitting, Left Arm, Standard)        Physical Exam (Skylinn Vialpando A. Margaretann Abate MD; 10/02/2020 11:11 AM)  General Mental Status-Alert. General Appearance-Consistent with stated age. Hydration-Well hydrated. Voice-Normal.  Head and Neck Head-normocephalic, atraumatic with no lesions or palpable masses.  Eye Eyeball - Bilateral-Extraocular movements intact. Sclera/Conjunctiva - Bilateral-No scleral icterus.  Chest and Lung Exam Chest and lung exam reveals -quiet, even and easy respiratory effort with no use of accessory muscles and on auscultation, normal breath sounds, no adventitious sounds and normal vocal  resonance. Inspection Chest Wall - Normal. Back - normal.  Cardiovascular Cardiovascular examination reveals -on palpation PMI is normal in location and amplitude, no palpable S3 or S4. Normal cardiac borders., normal heart sounds, regular rate and rhythm with no murmurs, carotid auscultation reveals no bruits and normal pedal pulses bilaterally.  Abdomen Inspection Inspection of the abdomen reveals - No Hernias. Skin - Scar - no surgical scars. Palpation/Percussion Palpation and Percussion of the abdomen reveal - Soft, Non Tender, No Rebound tenderness, No Rigidity (guarding) and No hepatosplenomegaly. Auscultation Auscultation of the abdomen reveals - Bowel sounds normal.  Neurologic Neurologic evaluation reveals -alert and oriented x 3 with no impairment of recent or remote memory. Mental Status-Normal.  Musculoskeletal Normal Exam - Left-Upper Extremity Strength Normal and Lower Extremity Strength Normal. Normal Exam - Right-Upper Extremity Strength Normal, Lower Extremity Weakness.    Assessment & Plan (Catriona Dillenbeck A. Izeah Vossler MD; 10/02/2020 11:13 AM)  SYMPTOMATIC CHOLELITHIASIS (K80.20) Impression: Patient's presents with symptomatic cholelithiasis. Recommend laparoscopic cholecystectomy. Discussed medical options as well and risks reducing PA versus low-fat diet for now. He has opted for laparoscopic cholecystectomy after reviewing the pros and cons of surgery as well as complications against the pros and cons of medical management and long-term outcomes.  TOTAL TIME 30 MINUTES  Current Plans You are being scheduled for surgery- Our schedulers will call you.  You should hear from our office's scheduling department within 5 working days about the location, date, and time of surgery. We try to make accommodations for patient's preferences in scheduling surgery, but sometimes the OR schedule or the surgeon's schedule prevents [place] from making those accommodations.  If you  have not heard from our office [phone]) in 5 working days, call the office and ask for your surgeon's nurse.  If you have other questions about your diagnosis, plan, or surgery, call the office and ask for your surgeon's nurse.  Pt Education - Pamphlet Given - Laparoscopic Gallbladder Surgery: discussed with patient and provided information. The anatomy & physiology of hepatobiliary & pancreatic function was discussed. The pathophysiology of gallbladder dysfunction was discussed. Natural history risks without surgery was discussed. I feel the  risks of no intervention will lead to serious problems that outweigh the operative risks; therefore, I recommended cholecystectomy to remove the pathology. I explained laparoscopic techniques with possible need for an open approach. Probable cholangiogram to evaluate the bilary tract was explained as well.  Risks such as bleeding, infection, abscess, leak, injury to other organs, need for further treatment, heart attack, death, and other risks were discussed. I noted a good likelihood this will help address the problem. Possibility that this will not correct all abdominal symptoms was explained. Goals of post-operative recovery were discussed as well. We will work to minimize complications. An educational handout further explaining the pathology and treatment options was given as well. Questions were answered. The patient expresses understanding & wishes to proceed with surgery.  Pt Education - [facility] Laparosopic Post Op HCI (Gross) Pt Education - [facility] Good Bowel Health (Gross) Pt Education - Laparoscopic Cholecystectomy: gallbladder

## 2020-11-07 ENCOUNTER — Other Ambulatory Visit: Payer: Self-pay | Admitting: Medical

## 2020-11-16 ENCOUNTER — Other Ambulatory Visit (HOSPITAL_COMMUNITY)
Admission: RE | Admit: 2020-11-16 | Discharge: 2020-11-16 | Disposition: A | Discharge status: Home / Self Care | Payer: BC Managed Care – PPO | Source: Ambulatory Visit | Attending: Surgery | Admitting: Surgery

## 2020-11-16 ENCOUNTER — Encounter (HOSPITAL_COMMUNITY): Payer: Self-pay | Admitting: Surgery

## 2020-11-16 ENCOUNTER — Other Ambulatory Visit: Payer: Self-pay

## 2020-11-16 DIAGNOSIS — Z01812 Encounter for preprocedural laboratory examination: Secondary | ICD-10-CM | POA: Diagnosis not present

## 2020-11-16 DIAGNOSIS — Z20822 Contact with and (suspected) exposure to covid-19: Secondary | ICD-10-CM | POA: Diagnosis not present

## 2020-11-16 LAB — SARS CORONAVIRUS 2 (TAT 6-24 HRS): SARS Coronavirus 2: NEGATIVE

## 2020-11-16 NOTE — Progress Notes (Signed)
Mr. Rickey [name] denies chest pain or shortness of breath.  Patient denies any s/s of Covid in himself or household, nor has he been in contact with anyone who has. Mr. Rickey [name] was tested for Covid today and is in quarantine with his family.

## 2020-11-19 NOTE — Anesthesia Preprocedure Evaluation (Addendum)
Anesthesia Evaluation  Patient identified by MRN, date of birth, ID band Patient awake    Reviewed: Allergy & Precautions, NPO status , Patient's Chart, lab work & pertinent test results  Airway Mallampati: II  TM Distance: >3 FB Neck ROM: Full    Dental  (+) Dental Advisory Given   Pulmonary neg pulmonary ROS,    breath sounds clear to auscultation       Cardiovascular negative cardio ROS   Rhythm:Regular Rate:Normal     Neuro/Psych ADD and autistic spectrum negative neurological ROS     GI/Hepatic Neg liver ROS, GERD  ,  Endo/Other  negative endocrine ROS  Renal/GU negative Renal ROS     Musculoskeletal   Abdominal   Peds  Hematology negative hematology ROS (+)   Anesthesia Other Findings   Reproductive/Obstetrics                            Anesthesia Physical Anesthesia Plan  ASA: II  Anesthesia Plan: General   Post-op Pain Management:    Induction: Intravenous  PONV Risk Score and Plan: 3 and Ondansetron, Dexamethasone and Treatment may vary due to age or medical condition  Airway Management Planned: Oral ETT  Additional Equipment:   Intra-op Plan:   Post-operative Plan: Extubation in OR  Informed Consent: I have reviewed the patients History and Physical, chart, labs and discussed the procedure including the risks, benefits and alternatives for the proposed anesthesia with the patient or authorized representative who has indicated his/her understanding and acceptance.     Dental advisory given  Plan Discussed with: CRNA  Anesthesia Plan Comments:        Anesthesia Quick Evaluation

## 2020-11-20 ENCOUNTER — Encounter (HOSPITAL_COMMUNITY): Payer: Self-pay | Admitting: Surgery

## 2020-11-20 ENCOUNTER — Encounter (HOSPITAL_COMMUNITY)
Admission: RE | Disposition: A | Discharge status: Home / Self Care | Payer: Self-pay | Source: Home / Self Care | Attending: Surgery

## 2020-11-20 ENCOUNTER — Ambulatory Visit (HOSPITAL_COMMUNITY)
Admission: RE | Admit: 2020-11-20 | Discharge: 2020-11-20 | Disposition: A | Discharge status: Home / Self Care | Payer: BC Managed Care – PPO | Attending: Surgery | Admitting: Surgery

## 2020-11-20 ENCOUNTER — Ambulatory Visit (HOSPITAL_COMMUNITY): Payer: BC Managed Care – PPO | Admitting: Anesthesiology

## 2020-11-20 ENCOUNTER — Other Ambulatory Visit: Payer: Self-pay

## 2020-11-20 DIAGNOSIS — K219 Gastro-esophageal reflux disease without esophagitis: Secondary | ICD-10-CM | POA: Diagnosis not present

## 2020-11-20 DIAGNOSIS — K8010 Calculus of gallbladder with chronic cholecystitis without obstruction: Secondary | ICD-10-CM | POA: Insufficient documentation

## 2020-11-20 DIAGNOSIS — F840 Autistic disorder: Secondary | ICD-10-CM | POA: Diagnosis not present

## 2020-11-20 DIAGNOSIS — K8020 Calculus of gallbladder without cholecystitis without obstruction: Secondary | ICD-10-CM | POA: Diagnosis not present

## 2020-11-20 HISTORY — DX: Family history of other specified conditions: Z84.89

## 2020-11-20 HISTORY — PX: CHOLECYSTECTOMY: SHX55

## 2020-11-20 HISTORY — DX: Gastro-esophageal reflux disease without esophagitis: K21.9

## 2020-11-20 LAB — CBC WITH DIFFERENTIAL/PLATELET
Abs Immature Granulocytes: 0.01 10*3/uL (ref 0.00–0.07)
Basophils Absolute: 0 10*3/uL (ref 0.0–0.1)
Basophils Relative: 1 %
Eosinophils Absolute: 0.2 10*3/uL (ref 0.0–0.5)
Eosinophils Relative: 4 %
HCT: 49.4 % (ref 39.0–52.0)
Hemoglobin: 17.2 g/dL — ABNORMAL HIGH (ref 13.0–17.0)
Immature Granulocytes: 0 %
Lymphocytes Relative: 24 %
Lymphs Abs: 1.5 10*3/uL (ref 0.7–4.0)
MCH: 29.9 pg (ref 26.0–34.0)
MCHC: 34.8 g/dL (ref 30.0–36.0)
MCV: 85.8 fL (ref 80.0–100.0)
Monocytes Absolute: 0.6 10*3/uL (ref 0.1–1.0)
Monocytes Relative: 10 %
Neutro Abs: 4.1 10*3/uL (ref 1.7–7.7)
Neutrophils Relative %: 61 %
Platelets: 210 10*3/uL (ref 150–400)
RBC: 5.76 MIL/uL (ref 4.22–5.81)
RDW: 12.5 % (ref 11.5–15.5)
WBC: 6.5 10*3/uL (ref 4.0–10.5)
nRBC: 0 % (ref 0.0–0.2)

## 2020-11-20 LAB — COMPREHENSIVE METABOLIC PANEL
ALT: 27 U/L (ref 0–44)
AST: 20 U/L (ref 15–41)
Albumin: 4.3 g/dL (ref 3.5–5.0)
Alkaline Phosphatase: 77 U/L (ref 38–126)
Anion gap: 13 (ref 5–15)
BUN: 8 mg/dL (ref 6–20)
CO2: 24 mmol/L (ref 22–32)
Calcium: 9.7 mg/dL (ref 8.9–10.3)
Chloride: 102 mmol/L (ref 98–111)
Creatinine, Ser: 0.88 mg/dL (ref 0.61–1.24)
GFR, Estimated: >60 mL/min (ref >60)
Glucose, Bld: 103 mg/dL — ABNORMAL HIGH (ref 70–99)
Potassium: 3.4 mmol/L — ABNORMAL LOW (ref 3.5–5.1)
Sodium: 139 mmol/L (ref 135–145)
Total Bilirubin: 1.4 mg/dL — ABNORMAL HIGH (ref 0.3–1.2)
Total Protein: 7.2 g/dL (ref 6.5–8.1)

## 2020-11-20 SURGERY — LAPAROSCOPIC CHOLECYSTECTOMY
Anesthesia: General | Site: Abdomen

## 2020-11-20 MED ORDER — SUGAMMADEX SODIUM 200 MG/2ML IV SOLN
INTRAVENOUS | Status: DC | PRN
  Administered 2020-11-20: 50 mg via INTRAVENOUS
  Administered 2020-11-20: 200 mg via INTRAVENOUS

## 2020-11-20 MED ORDER — MIDAZOLAM HCL 2 MG/2ML IJ SOLN
INTRAMUSCULAR | Status: DC | PRN
  Administered 2020-11-20: 2 mg via INTRAVENOUS

## 2020-11-20 MED ORDER — DEXAMETHASONE SODIUM PHOSPHATE 10 MG/ML IJ SOLN
INTRAMUSCULAR | Status: DC | PRN
  Administered 2020-11-20: 5 mg via INTRAVENOUS

## 2020-11-20 MED ORDER — PROPOFOL 10 MG/ML IV BOLUS
INTRAVENOUS | Status: DC | PRN
  Administered 2020-11-20: 50 mg via INTRAVENOUS
  Administered 2020-11-20: 200 mg via INTRAVENOUS

## 2020-11-20 MED ORDER — FENTANYL CITRATE (PF) 100 MCG/2ML IJ SOLN
INTRAMUSCULAR | Status: AC
  Filled 2020-11-20: qty 2

## 2020-11-20 MED ORDER — ROCURONIUM BROMIDE 10 MG/ML (PF) SYRINGE
PREFILLED_SYRINGE | INTRAVENOUS | Status: DC | PRN
  Administered 2020-11-20: 20 mg via INTRAVENOUS
  Administered 2020-11-20: 50 mg via INTRAVENOUS

## 2020-11-20 MED ORDER — ONDANSETRON HCL 4 MG/2ML IJ SOLN
INTRAMUSCULAR | Status: DC | PRN
  Administered 2020-11-20: 4 mg via INTRAVENOUS

## 2020-11-20 MED ORDER — MIDAZOLAM HCL 2 MG/2ML IJ SOLN
INTRAMUSCULAR | Status: AC
  Filled 2020-11-20: qty 2

## 2020-11-20 MED ORDER — FENTANYL CITRATE (PF) 250 MCG/5ML IJ SOLN
INTRAMUSCULAR | Status: AC
  Filled 2020-11-20: qty 5

## 2020-11-20 MED ORDER — BUPIVACAINE-EPINEPHRINE 0.25% -1:200000 IJ SOLN
INTRAMUSCULAR | Status: DC | PRN
  Administered 2020-11-20: 5 mL

## 2020-11-20 MED ORDER — DEXMEDETOMIDINE (PRECEDEX) IN NS 20 MCG/5ML (4 MCG/ML) IV SYRINGE
PREFILLED_SYRINGE | INTRAVENOUS | Status: DC | PRN
  Administered 2020-11-20: 8 ug via INTRAVENOUS
  Administered 2020-11-20 (×2): 12 ug via INTRAVENOUS
  Administered 2020-11-20: 8 ug via INTRAVENOUS

## 2020-11-20 MED ORDER — ORAL CARE MOUTH RINSE: 15.0000 mL | Freq: Once | OROMUCOSAL | Status: AC

## 2020-11-20 MED ORDER — BUPIVACAINE-EPINEPHRINE (PF) 0.25% -1:200000 IJ SOLN
INTRAMUSCULAR | Status: AC
  Filled 2020-11-20: qty 30

## 2020-11-20 MED ORDER — GABAPENTIN 300 MG PO CAPS
300.0000 mg | ORAL_CAPSULE | ORAL | Status: AC
  Administered 2020-11-20: 300 mg via ORAL
  Filled 2020-11-20: qty 1

## 2020-11-20 MED ORDER — FENTANYL CITRATE (PF) 100 MCG/2ML IJ SOLN
25.0000 ug | INTRAMUSCULAR | Status: DC | PRN
  Administered 2020-11-20 (×3): 50 ug via INTRAVENOUS

## 2020-11-20 MED ORDER — ACETAMINOPHEN 500 MG PO TABS
1000.0000 mg | ORAL_TABLET | ORAL | Status: AC
  Administered 2020-11-20: 1000 mg via ORAL
  Filled 2020-11-20: qty 2

## 2020-11-20 MED ORDER — EPHEDRINE SULFATE-NACL 50-0.9 MG/10ML-% IV SOSY
PREFILLED_SYRINGE | INTRAVENOUS | Status: DC | PRN
  Administered 2020-11-20: 20 mg via INTRAVENOUS

## 2020-11-20 MED ORDER — AMISULPRIDE (ANTIEMETIC) 5 MG/2ML IV SOLN: 10.0000 mg | Freq: Once | INTRAVENOUS | Status: DC | PRN

## 2020-11-20 MED ORDER — SODIUM CHLORIDE 0.9 % IR SOLN
Status: DC | PRN
  Administered 2020-11-20: 1000 mL

## 2020-11-20 MED ORDER — LIDOCAINE 2% (20 MG/ML) 5 ML SYRINGE
INTRAMUSCULAR | Status: DC | PRN
  Administered 2020-11-20: 80 mg via INTRAVENOUS

## 2020-11-20 MED ORDER — CHLORHEXIDINE GLUCONATE 0.12 % MT SOLN: 15.0000 mL | Freq: Once | OROMUCOSAL | Status: AC

## 2020-11-20 MED ORDER — PROPOFOL 10 MG/ML IV BOLUS
INTRAVENOUS | Status: AC
  Filled 2020-11-20: qty 40

## 2020-11-20 MED ORDER — HYDROCODONE-ACETAMINOPHEN 5-325 MG PO TABS: 1.0000 | ORAL_TABLET | Freq: Four times a day (QID) | ORAL | 0 refills | Status: DC | PRN

## 2020-11-20 MED ORDER — CHLORHEXIDINE GLUCONATE 0.12 % MT SOLN
OROMUCOSAL | Status: AC
  Administered 2020-11-20: 15 mL via OROMUCOSAL
  Filled 2020-11-20: qty 15

## 2020-11-20 MED ORDER — FENTANYL CITRATE (PF) 250 MCG/5ML IJ SOLN
INTRAMUSCULAR | Status: DC | PRN
  Administered 2020-11-20 (×2): 50 ug via INTRAVENOUS
  Administered 2020-11-20: 150 ug via INTRAVENOUS

## 2020-11-20 MED ORDER — CHLORHEXIDINE GLUCONATE CLOTH 2 % EX PADS: 6.0000 | MEDICATED_PAD | Freq: Once | CUTANEOUS | Status: DC

## 2020-11-20 MED ORDER — KETOROLAC TROMETHAMINE 30 MG/ML IJ SOLN
INTRAMUSCULAR | Status: DC | PRN
  Administered 2020-11-20: 15 mg via INTRAVENOUS

## 2020-11-20 MED ORDER — CELECOXIB 200 MG PO CAPS
200.0000 mg | ORAL_CAPSULE | ORAL | Status: AC
  Administered 2020-11-20: 200 mg via ORAL
  Filled 2020-11-20: qty 1

## 2020-11-20 MED ORDER — CEFAZOLIN SODIUM-DEXTROSE 2-4 GM/100ML-% IV SOLN
2.0000 g | INTRAVENOUS | Status: AC
  Administered 2020-11-20: 2 g via INTRAVENOUS
  Filled 2020-11-20: qty 100

## 2020-11-20 MED ORDER — LACTATED RINGERS IV SOLN: INTRAVENOUS | Status: DC

## 2020-11-20 SURGICAL SUPPLY — 40 items
APPLIER CLIP ROT 10 11.4 M/L (STAPLE) ×2
BLADE CLIPPER SURG (BLADE) IMPLANT
CANISTER SUCT 3000ML PPV (MISCELLANEOUS) ×2 IMPLANT
CHLORAPREP W/TINT 26 (MISCELLANEOUS) ×2 IMPLANT
CLIP APPLIE ROT 10 11.4 M/L (STAPLE) ×1 IMPLANT
COVER SURGICAL LIGHT HANDLE (MISCELLANEOUS) ×2 IMPLANT
COVER WAND RF STERILE (DRAPES) ×2 IMPLANT
DERMABOND ADVANCED (GAUZE/BANDAGES/DRESSINGS) ×1
DERMABOND ADVANCED .7 DNX12 (GAUZE/BANDAGES/DRESSINGS) ×1 IMPLANT
ELECT REM PT RETURN 9FT ADLT (ELECTROSURGICAL) ×2
ELECTRODE REM PT RTRN 9FT ADLT (ELECTROSURGICAL) ×1 IMPLANT
GLOVE BIO SURGEON STRL SZ8 (GLOVE) ×2 IMPLANT
GLOVE SRG 8 PF TXTR STRL LF DI (GLOVE) ×1 IMPLANT
GLOVE SURG UNDER POLY LF SZ8 (GLOVE) ×1
GOWN STRL REUS W/ TWL LRG LVL3 (GOWN DISPOSABLE) ×2 IMPLANT
GOWN STRL REUS W/ TWL XL LVL3 (GOWN DISPOSABLE) ×1 IMPLANT
GOWN STRL REUS W/TWL LRG LVL3 (GOWN DISPOSABLE) ×2
GOWN STRL REUS W/TWL XL LVL3 (GOWN DISPOSABLE) ×1
KIT BASIN OR (CUSTOM PROCEDURE TRAY) ×2 IMPLANT
KIT TURNOVER KIT B (KITS) ×2 IMPLANT
NS IRRIG 1000ML POUR BTL (IV SOLUTION) ×2 IMPLANT
PAD ARMBOARD 7.5X6 YLW CONV (MISCELLANEOUS) ×2 IMPLANT
POUCH RETRIEVAL ECOSAC 10 (ENDOMECHANICALS) ×1 IMPLANT
POUCH RETRIEVAL ECOSAC 10MM (ENDOMECHANICALS) ×1
SCISSORS LAP 5X35 DISP (ENDOMECHANICALS) ×2 IMPLANT
SET IRRIG TUBING LAPAROSCOPIC (IRRIGATION / IRRIGATOR) ×2 IMPLANT
SET TUBE SMOKE EVAC HIGH FLOW (TUBING) ×2 IMPLANT
SLEEVE ENDOPATH XCEL 5M (ENDOMECHANICALS) ×2 IMPLANT
SPECIMEN JAR SMALL (MISCELLANEOUS) ×2 IMPLANT
SUT MNCRL AB 4-0 PS2 18 (SUTURE) ×2 IMPLANT
SUT VICRYL 0 UR6 27IN ABS (SUTURE) ×2 IMPLANT
SYR CONTROL 10ML LL (SYRINGE) ×2 IMPLANT
TOWEL GREEN STERILE (TOWEL DISPOSABLE) ×2 IMPLANT
TOWEL GREEN STERILE FF (TOWEL DISPOSABLE) ×2 IMPLANT
TRAY LAPAROSCOPIC MC (CUSTOM PROCEDURE TRAY) ×2 IMPLANT
TROCAR XCEL BLUNT TIP 100MML (ENDOMECHANICALS) ×2 IMPLANT
TROCAR XCEL NON-BLD 11X100MML (ENDOMECHANICALS) ×2 IMPLANT
TROCAR XCEL NON-BLD 5MMX100MML (ENDOMECHANICALS) ×2 IMPLANT
WARMER LAPAROSCOPE (MISCELLANEOUS) ×2 IMPLANT
WATER STERILE IRR 1000ML POUR (IV SOLUTION) ×2 IMPLANT

## 2020-11-20 NOTE — Transfer of Care (Signed)
Immediate Anesthesia Transfer of Care Note  Patient: Rickey [name]  Procedure(s) Performed: LAPAROSCOPIC CHOLECYSTECTOMY (N/A Abdomen)  Patient Location: PACU  Anesthesia Type:General  Level of Consciousness: drowsy, patient cooperative and responds to stimulation  Airway & Oxygen Therapy: Patient Spontanous Breathing  Post-op Assessment: Report given to RN and Post -op Vital signs reviewed and stable  Post vital signs: Reviewed and stable  Last Vitals:  Vitals Value Taken Time  BP 155/94 11/20/20 0913  Temp    Pulse 86 11/20/20 0913  Resp 18 11/20/20 0913  SpO2 95 % 11/20/20 0913  Vitals shown include unvalidated device data.  Last Pain:  Vitals:   11/20/20 0600  TempSrc:   PainSc: 0-No pain         Complications: No complications documented.

## 2020-11-20 NOTE — Anesthesia Procedure Notes (Signed)
Procedure Name: Intubation Date/Time: 11/20/2020 7:40 AM Performed by: Janace Litten, CRNA Pre-anesthesia Checklist: Patient identified, Emergency Drugs available, Suction available and Patient being monitored Patient Re-evaluated:Patient Re-evaluated prior to induction Oxygen Delivery Method: Circle System Utilized Preoxygenation: Pre-oxygenation with 100% oxygen Induction Type: IV induction Ventilation: Mask ventilation without difficulty Laryngoscope Size: Mac and 4 Grade View: Grade I Tube type: Oral Number of attempts: 1 Airway Equipment and Method: Stylet Placement Confirmation: ETT inserted through vocal cords under direct vision,  positive ETCO2 and breath sounds checked- equal and bilateral Secured at: 23 cm Tube secured with: Tape Dental Injury: Teeth and Oropharynx as per pre-operative assessment

## 2020-11-20 NOTE — Discharge Instructions (Signed)
[facility] ______[facility] °LAPAROSCOPIC SURGERY: POST OP INSTRUCTIONS °Always review your discharge instruction sheet given to you by the facility where your surgery was performed. °IF YOU HAVE DISABILITY OR FAMILY LEAVE FORMS, YOU MUST BRING THEM TO THE OFFICE FOR PROCESSING.   °DO NOT GIVE THEM TO YOUR DOCTOR. ° °1. A prescription for pain medication may be given to you upon discharge.  Take your pain medication as prescribed, if needed.  If narcotic pain medicine is not needed, then you may take acetaminophen (Tylenol) or ibuprofen (Advil) as needed. °2. Take your usually prescribed medications unless otherwise directed. °3. If you need a refill on your pain medication, please contact your pharmacy.  They will contact our office to request authorization. Prescriptions will not be filled after 5pm or on week-ends. °4. You should follow a light diet the first few days after arrival home, such as soup and crackers, etc.  Be sure to include lots of fluids daily. °5. Most patients will experience some swelling and bruising in the area of the incisions.  Ice packs will help.  Swelling and bruising can take several days to resolve.  °6. It is common to experience some constipation if taking pain medication after surgery.  Increasing fluid intake and taking a stool softener (such as Colace) will usually help or prevent this problem from occurring.  A mild laxative (Milk of Magnesia or Miralax) should be taken according to package instructions if there are no bowel movements after 48 hours. °7. Unless discharge instructions indicate otherwise, you may remove your bandages 24-48 hours after surgery, and you may shower at that time.  You may have steri-strips (small skin tapes) in place directly over the incision.  These strips should be left on the skin for 7-10 days.  If your surgeon used skin glue on the incision, you may shower in 24 hours.  The glue will flake off over the next 2-3 weeks.  Any sutures or  staples will be removed at the office during your follow-up visit. °8. ACTIVITIES:  You may resume regular (light) daily activities beginning the next day--such as daily self-care, walking, climbing stairs--gradually increasing activities as tolerated.  You may have sexual intercourse when it is comfortable.  Refrain from any heavy lifting or straining until approved by your doctor. °a. You may drive when you are no longer taking prescription pain medication, you can comfortably wear a seatbelt, and you can safely maneuver your car and apply brakes. °b. RETURN TO WORK:  __________________________________________________________ °9. You should see your doctor in the office for a follow-up appointment approximately 2-3 weeks after your surgery.  Make sure that you call for this appointment within a day or two after you arrive home to insure a convenient appointment time. °10. OTHER INSTRUCTIONS: __________________________________________________________________________________________________________________________ __________________________________________________________________________________________________________________________ °WHEN TO CALL YOUR DOCTOR: °1. Fever over 101.0 °2. Inability to urinate °3. Continued bleeding from incision. °4. Increased pain, redness, or drainage from the incision. °5. Increasing abdominal pain ° °The clinic staff is available to answer your questions during regular business hours.  Please don’t hesitate to call and ask to speak to one of the nurses for clinical concerns.  If you have a medical emergency, go to the nearest emergency room or call 911.  A surgeon from [facility] is always on call at the hospital. °[address], [address], [place], [place]  [address] ? [address] [address], , [place]   [address] °[phone] ? [phone] ? FAX [phone] °Web site:   [email or web address] °

## 2020-11-20 NOTE — Op Note (Signed)
Laparoscopic Cholecystectomy  Procedure Note  Indications: This patient presents with symptomatic gallbladder disease and will undergo laparoscopic cholecystectomy.  Pre-operative Diagnosis: Calculus of gallbladder without mention of cholecystitis or obstruction  Post-operative Diagnosis: Calculus of gallbladder without mention of cholecystitis or obstruction  Surgeon: Dortha Schwalbe MD   Assistants: Dr Dionicia Abler MD   Anesthesia: General endotracheal anesthesia and Local anesthesia 0.25.% bupivacaine, with epinephrine  ASA Class: 1  Procedure Details  The patient was seen again in the Holding Room. The risks, benefits, complications, treatment options, and expected outcomes were discussed with the patient. The possibilities of reaction to medication, pulmonary aspiration, perforation of viscus, bleeding, recurrent infection, finding a normal gallbladder, the need for additional procedures, failure to diagnose a condition, the possible need to convert to an open procedure, and creating a complication requiring transfusion or operation were discussed with the patient. The patient and/or family concurred with the proposed plan, giving informed consent. The site of surgery properly noted/marked. The patient was taken to Operating Room, identified as Rickey [name] and the procedure verified as Laparoscopic Cholecystectomy with Intraoperative Cholangiograms. A Time Out was held and the above information confirmed.  Prior to the induction of general anesthesia, antibiotic prophylaxis was administered. General endotracheal anesthesia was then administered and tolerated well. After the induction, the abdomen was prepped in the usual sterile fashion. The patient was positioned in the supine position with the left arm comfortably tucked, along with some reverse Trendelenburg.  Local anesthetic agent was injected into the skin near the umbilicus and an incision made. The midline fascia was incised and the  Hasson technique was used to introduce a 12 mm port under direct vision. It was secured with a figure of eight Vicryl suture placed in the usual fashion. Pneumoperitoneum was then created with CO2 and tolerated well without any adverse changes in the patient's vital signs. Additional trocars were introduced under direct vision with an 11 mm trocar in the epigastrium and 2 5 mm trocars in the right upper quadrant. All skin incisions were infiltrated with a local anesthetic agent before making the incision and placing the trocars.   The gallbladder was identified, the fundus grasped and retracted cephalad. Adhesions were lysed bluntly and with the electrocautery where indicated, taking care not to injure any adjacent organs or viscus. The infundibulum was grasped and retracted laterally, exposing the peritoneum overlying the triangle of Calot. This was then divided and exposed in a blunt fashion. The cystic duct was clearly identified and bluntly dissected circumferentially. The junctions of the gallbladder, cystic duct and common bile duct were clearly identified prior to the division of any linear structure.   The critical view was obtained.  The cystic duct was extremely small and would not accommodate a catheter.   His LFT and U/S showed no ductal dilation and labs were normal.  The cystic duct was then  ligated with surgical clips  on the patient side and  clipped on the gallbladder side and divided. The cystic artery was identified, dissected free, ligated with clips and divided as well. Posterior cystic artery clipped and divided.  The gallbladder was dissected from the liver bed in retrograde fashion with the electrocautery. The gallbladder was removed. The liver bed was irrigated and inspected. Hemostasis was achieved with the electrocautery. Copious irrigation was utilized and was repeatedly aspirated until clear all particulate matter. Hemostasis was achieved with no signs  Of bleeding or bile  leakage.  Pneumoperitoneum was completely reduced after viewing removal of the trocars  under direct vision. The wound was thoroughly irrigated and the fascia was then closed with a figure of eight suture; the skin was then closed with 4 O monocryl  and a sterile dressing was applied.  Instrument, sponge, and needle counts were correct at closure and at the conclusion of the case.   Findings:  Cholelithiasis  Estimated Blood Loss: Minimal         Drains: none          Total IV Fluids: per record          Specimens: Gallbladder           Complications: None; patient tolerated the procedure well.         Disposition: PACU - hemodynamically stable.         Condition: stable

## 2020-11-20 NOTE — H&P (Signed)
Rickey [name]  Location: [facility] Patient #: [id number] DOB: [birth year] Single / Language: [place] / Race: White Male  History of Present Illness (11:10 AM) Patient words: Patient sent at the request of Thersa Salt PA for evaluation of epigastric abdominal pain 2-3 months. Patient presents with a history of intermittent sharp epigastric pain after eating. Location is in the epigastrium and right upper quadrant. The pain last minutes to up to an hour. It is associated with nausea and vomiting. It was episodic occurring most times a month. No specific foods trigger. He does have bilious emesis and still we states. He was seen by his primary care physician who ordered an ultrasound which shows gallstones without gallbladder wall thickening. Common bile duct is 4 mm and liver function studies are within normal limits.     CLINICAL DATA: Epigastric abdominal pain.  EXAM: ABDOMEN ULTRASOUND COMPLETE  COMPARISON: None.  FINDINGS: Gallbladder: 1.7 cm gallstone is noted. No gallbladder wall thickening or pericholecystic fluid is noted. Mild amount of sludge is noted within gallbladder lumen. No sonographic Murphy's sign is noted.  Common bile duct: Diameter: 4 mm which is within normal limits.  Liver: No focal lesion identified. Within normal limits in parenchymal echogenicity. Portal vein is patent on color Doppler imaging with normal direction of blood flow towards the liver.  IVC: No abnormality visualized.  Pancreas: Visualized portion unremarkable.  Spleen: Size and appearance within normal limits.  Right Kidney: Length: 10.8 cm. Echogenicity within normal limits. No mass or hydronephrosis visualized.  Left Kidney: Length: 10.7 cm. Echogenicity within normal limits. No mass or hydronephrosis visualized.  Abdominal aorta: No aneurysm visualized.  Other findings: None.  IMPRESSION: Cholelithiasis and sludge is noted without  evidence of cholecystitis. No other abnormality seen in the abdomen.   Electronically Signed By: Lupita Raider M.D. On: 08/18/2020 10:19.  The patient is a 25 year old male.   Past Surgical History  AM) Oral Surgery  Diagnostic Studies History9:41 AM) Colonoscopy never  Allergies  No Known Drug Allergies [10/02/2020]: Allergies Reconciled  Medication History  Omeprazole (20MG  Capsule DR, Oral) Active. Medications Reconciled  Social History Alcohol use Occasional alcohol use. Caffeine use Carbonated beverages, Coffee, Tea. No drug use Tobacco use Never smoker.  Family History  Diabetes Mellitus Father.  Other Problems ( Gastroesophageal Reflux Disease     Review of Systems General Present- Appetite Loss. Not Present- Chills, Fatigue, Fever, Night Sweats, Weight Gain and Weight Loss. Skin Not Present- Change in Wart/Mole, Dryness, Hives, Jaundice, New Lesions, Non-Healing Wounds, Rash and Ulcer. HEENT Present- Seasonal Allergies. Not Present- Earache, Hearing Loss, Hoarseness, Nose Bleed, Oral Ulcers, Ringing in the Ears, Sinus Pain, Sore Throat, Visual Disturbances, Wears glasses/contact lenses and Yellow Eyes. Respiratory Not Present- Bloody sputum, Chronic Cough, Difficulty Breathing, Snoring and Wheezing. Breast Not Present- Breast Mass, Breast Pain, Nipple Discharge and Skin Changes. Cardiovascular Present- Difficulty Breathing Lying Down and Leg Cramps. Not Present- Chest Pain, Palpitations, Rapid Heart Rate, Shortness of Breath and Swelling of Extremities. Gastrointestinal Present- Abdominal Pain, Change in Bowel Habits, Chronic diarrhea, Constipation, Excessive gas, Hemorrhoids, Indigestion and Rectal Pain. Not Present- Bloating, Bloody Stool, Difficulty Swallowing, Gets full quickly at meals, Nausea and Vomiting. Male Genitourinary Not Present- Blood in Urine, Change in Urinary Stream, Frequency, Impotence, Nocturia, Painful  Urination, Urgency and Urine Leakage. Musculoskeletal Present- Back Pain and Muscle Pain. Not Present- Joint Pain, Joint Stiffness, Muscle Weakness and Swelling of Extremities. Neurological Not Present- Decreased Memory, Fainting, Headaches, Numbness, Seizures, Tingling, Tremor, Trouble  walking and Weakness. Psychiatric Not Present- Anxiety, Bipolar, Change in Sleep Pattern, Depression, Fearful and Frequent crying. Endocrine Not Present- Cold Intolerance, Excessive Hunger, Hair Changes, Heat Intolerance, Hot flashes and New Diabetes. Hematology Not Present- Blood Thinners, Easy Bruising, Excessive bleeding, Gland problems, HIV and Persistent Infections.  Vitals ( 10/02/2020 9:41 AM Weight: 201.6 lb Height: 74in Body Surface Area: 2.18 m Body Mass Index: 25.88 kg/m  Temp.: 98.40F  Pulse: 103 (Regular)  BP: 118/84(Sitting, Left Arm, Standard)        Physical Exam (  General Mental Status-Alert. General Appearance-Consistent with stated age. Hydration-Well hydrated. Voice-Normal.  Head and Neck Head-normocephalic, atraumatic with no lesions or palpable masses.  Eye Eyeball - Bilateral-Extraocular movements intact. Sclera/Conjunctiva - Bilateral-No scleral icterus.  Chest and Lung Exam Chest and lung exam reveals -quiet, even and easy respiratory effort with no use of accessory muscles and on auscultation, normal breath sounds, no adventitious sounds and normal vocal resonance. Inspection Chest Wall - Normal. Back - normal.  Cardiovascular Cardiovascular examination reveals -on palpation PMI is normal in location and amplitude, no palpable S3 or S4. Normal cardiac borders., normal heart sounds, regular rate and rhythm with no murmurs, carotid auscultation reveals no bruits and normal pedal pulses bilaterally.  Abdomen Inspection Inspection of the abdomen reveals - No Hernias. Skin - Scar - no surgical  scars. Palpation/Percussion Palpation and Percussion of the abdomen reveal - Soft, Non Tender, No Rebound tenderness, No Rigidity (guarding) and No hepatosplenomegaly. Auscultation Auscultation of the abdomen reveals - Bowel sounds normal.  Neurologic Neurologic evaluation reveals -alert and oriented x 3 with no impairment of recent or remote memory. Mental Status-Normal.  Musculoskeletal Normal Exam - Left-Upper Extremity Strength Normal and Lower Extremity Strength Normal. Normal Exam - Right-Upper Extremity Strength Normal, Lower Extremity Weakness.    Assessment & Plan AM)  SYMPTOMATIC CHOLELITHIASIS (K80.20) Impression: Patient's presents with symptomatic cholelithiasis. Recommend laparoscopic cholecystectomy. Discussed medical options as well and risks reducing PA versus low-fat diet for now. He has opted for laparoscopic cholecystectomy after reviewing the pros and cons of surgery as well as complications against the pros and cons of medical management and long-term outcomes.  TOTAL TIME 30 MINUTES  Current Plans You are being scheduled for surgery- Our schedulers will call you.  You should hear from our office's scheduling department within 5 working days about the location, date, and time of surgery. We try to make accommodations for patient's preferences in scheduling surgery, but sometimes the OR schedule or the surgeon's schedule prevents [place] from making those accommodations.  If you have not heard from our office [phone]) in 5 working days, call the office and ask for your surgeon's nurse.  If you have other questions about your diagnosis, plan, or surgery, call the office and ask for your surgeon's nurse.  Pt Education - Pamphlet Given - Laparoscopic Gallbladder Surgery: discussed with patient and provided information. The anatomy & physiology of hepatobiliary & pancreatic function was discussed. The pathophysiology of gallbladder  dysfunction was discussed. Natural history risks without surgery was discussed. I feel the risks of no intervention will lead to serious problems that outweigh the operative risks; therefore, I recommended cholecystectomy to remove the pathology. I explained laparoscopic techniques with possible need for an open approach. Probable cholangiogram to evaluate the bilary tract was explained as well.  Risks such as bleeding, infection, abscess, leak, injury to other organs, need for further treatment, heart attack, death, and other risks were discussed. I noted a good likelihood this will  help address the problem. Possibility that this will not correct all abdominal symptoms was explained. Goals of post-operative recovery were discussed as well. We will work to minimize complications. An educational handout further explaining the pathology and treatment options was given as well. Questions were answered. The patient expresses understanding & wishes to proceed with surgery.

## 2020-11-20 NOTE — Interval H&P Note (Signed)
History and Physical Interval Note:  11/20/2020 7:21 AM  Rickey [name]  has presented today for surgery, with the diagnosis of gallstones.  The various methods of treatment have been discussed with the patient and family. After consideration of risks, benefits and other options for treatment, the patient has consented to  Procedure(s): LAPAROSCOPIC CHOLECYSTECTOMY (N/A) as a surgical intervention.  The patient's history has been reviewed, patient examined, no change in status, stable for surgery.  I have reviewed the patient's chart and labs.  Questions were answered to the patient's satisfaction.     [name] A Shiva Karis

## 2020-11-20 NOTE — Anesthesia Postprocedure Evaluation (Signed)
Anesthesia Post Note  Patient: Rickey [name]  Procedure(s) Performed: LAPAROSCOPIC CHOLECYSTECTOMY (N/A Abdomen)     Patient location during evaluation: PACU Anesthesia Type: General Level of consciousness: awake and alert Pain management: pain level controlled Vital Signs Assessment: post-procedure vital signs reviewed and stable Respiratory status: spontaneous breathing, nonlabored ventilation, respiratory function stable and patient connected to nasal cannula oxygen Cardiovascular status: blood pressure returned to baseline and stable Postop Assessment: no apparent nausea or vomiting Anesthetic complications: no   No complications documented.  Last Vitals:  Vitals:   11/20/20 0958 11/20/20 1013  BP: 135/63 130/75  Pulse: 77 88  Resp: 14 12  Temp:  37.1 C  SpO2: 97% 99%    Last Pain:  Vitals:   11/20/20 1013  TempSrc:   PainSc: 0-No pain                 Kennieth Rad

## 2020-11-21 ENCOUNTER — Encounter (HOSPITAL_COMMUNITY): Payer: Self-pay | Admitting: Surgery

## 2020-11-21 LAB — SURGICAL PATHOLOGY

## 2021-02-05 ENCOUNTER — Encounter: Payer: BC Managed Care – PPO | Admitting: Family Medicine

## 2021-06-10 ENCOUNTER — Encounter: Payer: Self-pay | Admitting: Family Medicine

## 2021-06-10 ENCOUNTER — Ambulatory Visit (INDEPENDENT_AMBULATORY_CARE_PROVIDER_SITE_OTHER): Payer: BC Managed Care – PPO | Admitting: Family Medicine

## 2021-06-10 ENCOUNTER — Other Ambulatory Visit: Payer: Self-pay

## 2021-06-10 VITALS — BP 128/66 | HR 104 | Temp 98.6°F | Resp 12 | Ht 73.0 in | Wt 197.4 lb

## 2021-06-10 DIAGNOSIS — R739 Hyperglycemia, unspecified: Secondary | ICD-10-CM

## 2021-06-10 DIAGNOSIS — K909 Intestinal malabsorption, unspecified: Secondary | ICD-10-CM

## 2021-06-10 DIAGNOSIS — D582 Other hemoglobinopathies: Secondary | ICD-10-CM | POA: Diagnosis not present

## 2021-06-10 DIAGNOSIS — Z23 Encounter for immunization: Secondary | ICD-10-CM

## 2021-06-10 DIAGNOSIS — R109 Unspecified abdominal pain: Secondary | ICD-10-CM | POA: Diagnosis not present

## 2021-06-10 DIAGNOSIS — F988 Other specified behavioral and emotional disorders with onset usually occurring in childhood and adolescence: Secondary | ICD-10-CM

## 2021-06-10 DIAGNOSIS — Z0000 Encounter for general adult medical examination without abnormal findings: Secondary | ICD-10-CM

## 2021-06-10 NOTE — Assessment & Plan Note (Signed)
Patient encouraged to maintain heart healthy diet, regular exercise, adequate sleep. Consider daily probiotics. Take medications as prescribed. Labs ordered and reviewed. Given Tdap today 

## 2021-06-10 NOTE — Patient Instructions (Signed)
Preventive Care 21-24 Years Old, Male Preventive care refers to lifestyle choices and visits with your health care provider that can promote health and wellness. This includes: A yearly physical exam. This is also called an annual wellness visit. Regular dental and eye exams. Immunizations. Screening for certain conditions. Healthy lifestyle choices, such as: Eating a healthy diet. Getting regular exercise. Not using drugs or products that contain nicotine and tobacco. Limiting alcohol use. What can I expect for my preventive care visit? Physical exam Your health care provider may check your: Height and weight. These may be used to calculate your BMI (body mass index). BMI is a measurement that tells if you are at a healthy weight. Heart rate and blood pressure. Body temperature. Skin for abnormal spots. Counseling Your health care provider may ask you questions about your: Past medical problems. Family's medical history. Alcohol, tobacco, and drug use. Emotional well-being. Home life and relationship well-being. Sexual activity. Diet, exercise, and sleep habits. Work and work environment. Access to firearms. What immunizations do I need?  Vaccines are usually given at various ages, according to a schedule. Your health care provider will recommend vaccines for you based on your age, medicalhistory, and lifestyle or other factors, such as travel or where you work. What tests do I need? Blood tests Lipid and cholesterol levels. These may be checked every 5 years starting at age 20. Hepatitis C test. Hepatitis B test. Screening  Diabetes screening. This is done by checking your blood sugar (glucose) after you have not eaten for a while (fasting). Genital exam to check for testicular cancer or hernias. STD (sexually transmitted disease) testing, if you are at risk. Talk with your health care provider about your test results, treatment options,and if necessary, the need for more  tests. Follow these instructions at home: Eating and drinking  Eat a healthy diet that includes fresh fruits and vegetables, whole grains, lean protein, and low-fat dairy products. Drink enough fluid to keep your urine pale yellow. Take vitamin and mineral supplements as recommended by your health care provider. Do not drink alcohol if your health care provider tells you not to drink. If you drink alcohol: Limit how much you have to 0-2 drinks a day. Be aware of how much alcohol is in your drink. In the [place] one drink equals one 12 oz bottle of beer (355 mL), one 5 oz glass of wine (148 mL), or one 1 oz glass of hard liquor (44 mL).  Lifestyle Take daily care of your teeth and gums. Brush your teeth every morning and night with fluoride toothpaste. Floss one time each day. Stay active. Exercise for at least 30 minutes 5 or more days each week. Do not use any products that contain nicotine or tobacco, such as cigarettes, e-cigarettes, and chewing tobacco. If you need help quitting, ask your health care provider. Do not use drugs. If you are sexually active, practice safe sex. Use a condom or other form of protection to prevent STIs (sexually transmitted infections). Find healthy ways to cope with stress, such as: Meditation, yoga, or listening to music. Journaling. Talking to a trusted person. Spending time with friends and family. Safety Always wear your seat belt while driving or riding in a vehicle. Do not drive: If you have been drinking alcohol. Do not ride with someone who has been drinking. When you are tired or distracted. While texting. Wear a helmet and other protective equipment during sports activities. If you have firearms in your house, make sure   you follow all gun safety procedures. Seek help if you have been physically or sexually abused. What's next? Go to your health care provider once a year for an annual wellness visit. Ask your health care provider how often  you should have your eyes and teeth checked. Stay up to date on all vaccines. This information is not intended to replace advice given to you by your health care provider. Make sure you discuss any questions you have with your healthcare provider. Document Revised: 06/29/2019 Document Reviewed: 10/07/2018 Elsevier Patient Education  2022 Elsevier Inc.  

## 2021-06-10 NOTE — Progress Notes (Signed)
Subjective:   By signing my name below, I, Zite Okoli, attest that this documentation has been prepared under the direction and in the presence of Bradd Canary, MD 06/10/2021   Patient ID: Rickey [name], male    DOB: [birth year], 25 y.o.   MRN: [id number]  Chief Complaint  Patient presents with   Annual Exam    HPI Patient is in today for a comprehensive physical exam.  He manages his heartburn with 20 mg omeprazole PO. He has not been using it frequently and he only experiences heartburn once in two weeks. He has been trying to reduce the amount of processes and spicy foods.  He reports that while having bowel movements, there is often a yellow bile that accompanies it. He often has 2-3 bowel movements daily and the bile often occurs once a week.   At this visit, his pulse was a little high. He mentions that he often checks his pulse at work because he noticed it got higher after his cholecystectomy.  He denies fever, congestion, eye pain, shortness of breath, chest pain, palpitations, leg swelling. Also denies abdominal pain, blood in stool, dysuria, frequency, diarrhea, nausea, back pain and headaches.  He is interested in getting the tetanus booster vaccine. He is not interested in getting the Covid-19 vaccines at this time.  He is managing a healthy diet.  There has been no recent changes in the family medical history. He had a cholecystectomy in January 2022.  Past Medical History:  Diagnosis Date   Acne    ADD (attention deficit disorder)    Autism spectrum disorder 06/24/2015   Autistic disorder    Family history of adverse reaction to anesthesia    Father and brother woke up during surgery   GERD (gastroesophageal reflux disease)    Preventative health care 06/24/2015    Past Surgical History:  Procedure Laterality Date   ADENOIDECTOMY AND MYRINGOTOMY WITH TUBE PLACEMENT     CHOLECYSTECTOMY N/A 11/20/2020   Procedure: LAPAROSCOPIC CHOLECYSTECTOMY;  Surgeon:  Harriette Bouillon, MD;  Location: [facility] OR;  Service: General;  Laterality: N/A;  LAPAROSCOPIC CHOLECYSTECTOMY   WISDOM TOOTH EXTRACTION      Family History  Problem Relation Age of Onset   Diabetes Father    Kidney disease Brother        kidney stones   Diabetes Paternal Grandmother    Heart disease Paternal Grandmother    Diabetes Paternal Grandfather    Heart disease Paternal Grandfather     Social History   Socioeconomic History   Marital status: Single    Spouse name: Not on file   Number of children: Not on file   Years of education: Not on file   Highest education level: Not on file  Occupational History   Occupation: Consulting civil engineer at [employer]  Tobacco Use   Smoking status: Never   Smokeless tobacco: Never  Vaping Use   Vaping Use: Never used  Substance and Sexual Activity   Alcohol use: Yes    Alcohol/week: 0.0 standard drinks    Comment: sporatic - Beer- Liquior  (Beer 3-4, Liquor 4)   Drug use: No   Sexual activity: Never    Comment: lives with parents and brother,  Other Topics Concern   Not on file  Social History Narrative   Not on file   Social Determinants of Health   Financial Resource Strain: Not on file  Food Insecurity: Not on file  Transportation Needs: Not on file  Physical  Activity: Not on file  Stress: Not on file  Social Connections: Not on file  Intimate Partner Violence: Not on file    Outpatient Medications Prior to Visit  Medication Sig Dispense Refill   omeprazole (PRILOSEC) 20 MG capsule TAKE 1 CAPSULE BY MOUTH DAILY (Patient taking differently: Take 20 mg by mouth daily. TAKE 1 CAPSULE BY MOUTH DAILY) 30 capsule 1   aspirin 325 MG tablet Take 325 mg by mouth daily as needed for moderate pain.     HYDROcodone-acetaminophen (NORCO/VICODIN) 5-325 MG tablet Take 1 tablet by mouth every 6 (six) hours as needed for moderate pain. 15 tablet 0   Melatonin 10 MG TABS Take 10 mg by mouth at bedtime as needed (sleep).     naproxen  sodium (ALEVE) 220 MG tablet Take 440 mg by mouth daily as needed (pain).     No facility-administered medications prior to visit.    No Known Allergies  Review of Systems  Constitutional:  Negative for fever.  HENT:  Negative for congestion.   Eyes:  Negative for pain.  Respiratory:  Negative for shortness of breath.   Cardiovascular:  Negative for chest pain, palpitations and leg swelling.  Gastrointestinal:  Negative for abdominal pain, blood in stool, diarrhea and nausea.       Yellow bile with stool  Genitourinary:  Negative for dysuria and frequency.  Musculoskeletal:  Negative for back pain.  Neurological:  Negative for headaches.      Objective:    Physical Exam Constitutional:      Appearance: He is not ill-appearing.  HENT:     Head: Normocephalic and atraumatic.     Right Ear: Tympanic membrane, ear canal and external ear normal.     Left Ear: Tympanic membrane, ear canal and external ear normal.  Eyes:     Conjunctiva/sclera: Conjunctivae normal.     Comments: No nystagmus  Cardiovascular:     Rate and Rhythm: Normal rate and regular rhythm.     Pulses:          Posterior tibial pulses are 2+ on the right side and 2+ on the left side.     Heart sounds: Normal heart sounds. No murmur heard. Pulmonary:     Effort: No respiratory distress.     Breath sounds: Normal breath sounds.  Abdominal:     General: Bowel sounds are normal. There is no distension.     Palpations: Abdomen is soft. There is no mass.     Tenderness: There is no abdominal tenderness.  Musculoskeletal:     Cervical back: Neck supple.     Comments: 5/5 strength in upper and lower extremities  Lymphadenopathy:     Cervical: No cervical adenopathy.  Skin:    General: Skin is warm and dry.  Neurological:     Mental Status: He is alert and oriented to person, place, and time.  Psychiatric:        Behavior: Behavior normal.    BP 128/66 (BP Location: Left Arm, Cuff Size: Normal)   Pulse  (!) 104   Temp 98.6 F (37 C) (Oral)   Resp 12   Ht 6\' 1"  (1.854 m)   Wt 197 lb 6.4 oz (89.5 kg)   SpO2 97%   BMI 26.04 kg/m  Wt Readings from Last 3 Encounters:  06/10/21 197 lb 6.4 oz (89.5 kg)  11/20/20 175 lb (79.4 kg)  08/20/20 204 lb (92.5 kg)    Diabetic Foot Exam - Simple   No  data filed    Lab Results  Component Value Date   WBC 9.2 06/10/2021   HGB 16.8 06/10/2021   HCT 48.5 06/10/2021   PLT 232.0 06/10/2021   GLUCOSE 80 06/10/2021   CHOL 141 06/10/2021   TRIG 128.0 06/10/2021   HDL 49.90 06/10/2021   LDLCALC 65 06/10/2021   ALT 29 06/10/2021   AST 20 06/10/2021   NA 140 06/10/2021   K 3.9 06/10/2021   CL 102 06/10/2021   CREATININE 0.98 06/10/2021   BUN 11 06/10/2021   CO2 28 06/10/2021   TSH 1.52 06/10/2021   HGBA1C 5.0 06/10/2021    Lab Results  Component Value Date   TSH 1.52 06/10/2021   Lab Results  Component Value Date   WBC 9.2 06/10/2021   HGB 16.8 06/10/2021   HCT 48.5 06/10/2021   MCV 86.7 06/10/2021   PLT 232.0 06/10/2021   Lab Results  Component Value Date   NA 140 06/10/2021   K 3.9 06/10/2021   CO2 28 06/10/2021   GLUCOSE 80 06/10/2021   BUN 11 06/10/2021   CREATININE 0.98 06/10/2021   BILITOT 0.8 06/10/2021   ALKPHOS 88 06/10/2021   AST 20 06/10/2021   ALT 29 06/10/2021   PROT 7.8 06/10/2021   ALBUMIN 5.0 06/10/2021   CALCIUM 10.5 06/10/2021   ANIONGAP 13 11/20/2020   GFR 108.09 06/10/2021   Lab Results  Component Value Date   CHOL 141 06/10/2021   Lab Results  Component Value Date   HDL 49.90 06/10/2021   Lab Results  Component Value Date   LDLCALC 65 06/10/2021   Lab Results  Component Value Date   TRIG 128.0 06/10/2021   Lab Results  Component Value Date   CHOLHDL 3 06/10/2021   Lab Results  Component Value Date   HGBA1C 5.0 06/10/2021       Assessment & Plan:   Problem List Items Addressed This Visit     ADD (attention deficit disorder)    Doing well off of medications       Preventative health care    Patient encouraged to maintain heart healthy diet, regular exercise, adequate sleep. Consider daily probiotics. Take medications as prescribed. Labs ordered and reviewed. Given Tdap today      Relevant Orders   Lipid panel (Completed)   TSH (Completed)   Other Visit Diagnoses     Hyperglycemia    -  Primary   Relevant Orders   Comprehensive metabolic panel (Completed)   Hemoglobin A1c (Completed)   Abnormal hemoglobin (Hgb) (HCC)       Relevant Orders   CBC (Completed)   Fatty stool       Relevant Orders   Amylase (Completed)   Lipase (Completed)   Abdominal pain, unspecified abdominal location       Relevant Orders   Amylase (Completed)   Lipase (Completed)   Need for Tdap vaccination       Relevant Orders   Tdap vaccine greater than or equal to 7yo IM (Completed)       No orders of the defined types were placed in this encounter.   I,Zite Okoli,acting as a Neurosurgeonscribe for Danise EdgeStacey Weston Kallman, MD.,have documented all relevant documentation on the behalf of Danise EdgeStacey Filip Luten, MD,as directed by  Danise EdgeStacey Brendon Christoffel, MD while in the presence of Danise EdgeStacey Ambrosia Wisnewski, MD.   I, Bradd CanaryBlyth Cote Mayabb A, MD , personally preformed the services described in this documentation.  All medical record entries made by the scribe were at my direction  and in my presence.  I have reviewed the chart and discharge instructions (if applicable) and agree that the record reflects my personal performance and is accurate and complete. 06/10/2021

## 2021-06-11 LAB — CBC
HCT: 48.5 % (ref 39.0–52.0)
Hemoglobin: 16.8 g/dL (ref 13.0–17.0)
MCHC: 34.6 g/dL (ref 30.0–36.0)
MCV: 86.7 fl (ref 78.0–100.0)
Platelets: 232 10*3/uL (ref 150.0–400.0)
RBC: 5.59 Mil/uL (ref 4.22–5.81)
RDW: 13.1 % (ref 11.5–15.5)
WBC: 9.2 10*3/uL (ref 4.0–10.5)

## 2021-06-11 LAB — HEMOGLOBIN A1C: Hgb A1c MFr Bld: 5 % (ref 4.6–6.5)

## 2021-06-11 LAB — LIPID PANEL
Cholesterol: 141 mg/dL (ref 0–200)
HDL: 49.9 mg/dL (ref >39.00)
LDL Cholesterol: 65 mg/dL (ref 0–99)
NonHDL: 90.84
Total CHOL/HDL Ratio: 3
Triglycerides: 128 mg/dL (ref 0.0–149.0)
VLDL: 25.6 mg/dL (ref 0.0–40.0)

## 2021-06-11 LAB — COMPREHENSIVE METABOLIC PANEL
ALT: 29 U/L (ref 0–53)
AST: 20 U/L (ref 0–37)
Albumin: 5 g/dL (ref 3.5–5.2)
Alkaline Phosphatase: 88 U/L (ref 39–117)
BUN: 11 mg/dL (ref 6–23)
CO2: 28 mEq/L (ref 19–32)
Calcium: 10.5 mg/dL (ref 8.4–10.5)
Chloride: 102 mEq/L (ref 96–112)
Creatinine, Ser: 0.98 mg/dL (ref 0.40–1.50)
GFR: 108.09 mL/min (ref >60.00)
Glucose, Bld: 80 mg/dL (ref 70–99)
Potassium: 3.9 mEq/L (ref 3.5–5.1)
Sodium: 140 mEq/L (ref 135–145)
Total Bilirubin: 0.8 mg/dL (ref 0.2–1.2)
Total Protein: 7.8 g/dL (ref 6.0–8.3)

## 2021-06-11 LAB — LIPASE: Lipase: 50 U/L (ref 11.0–59.0)

## 2021-06-11 LAB — TSH: TSH: 1.52 u[IU]/mL (ref 0.35–5.50)

## 2021-06-11 LAB — AMYLASE: Amylase: 43 U/L (ref 27–131)

## 2021-06-16 NOTE — Assessment & Plan Note (Signed)
Doing well off of medications.  

## 2021-07-10 IMAGING — US US ABDOMEN COMPLETE
1 series · 14 of 25 positions shown · non-contrast
Comparison: None.

CLINICAL DATA: Epigastric abdominal pain.

EXAM:
ABDOMEN ULTRASOUND COMPLETE

[Series 1: us abdomen complete · 14 of 80 slices shown]
[im 1/80]
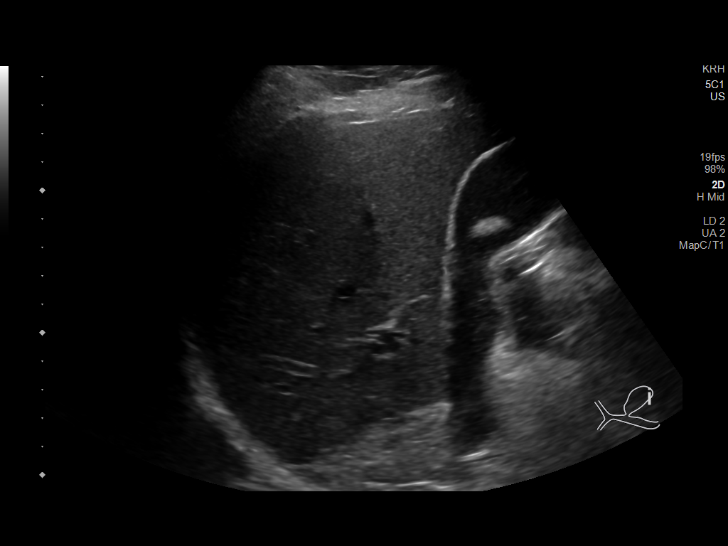
[im 7/80]
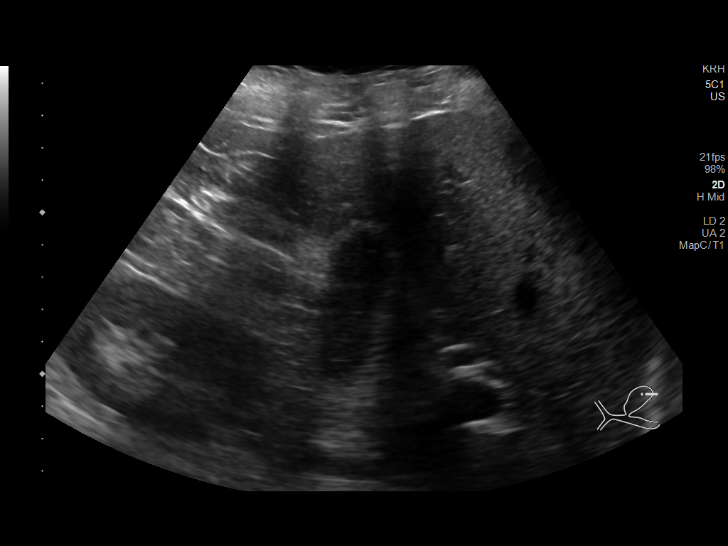
[im 14/80]
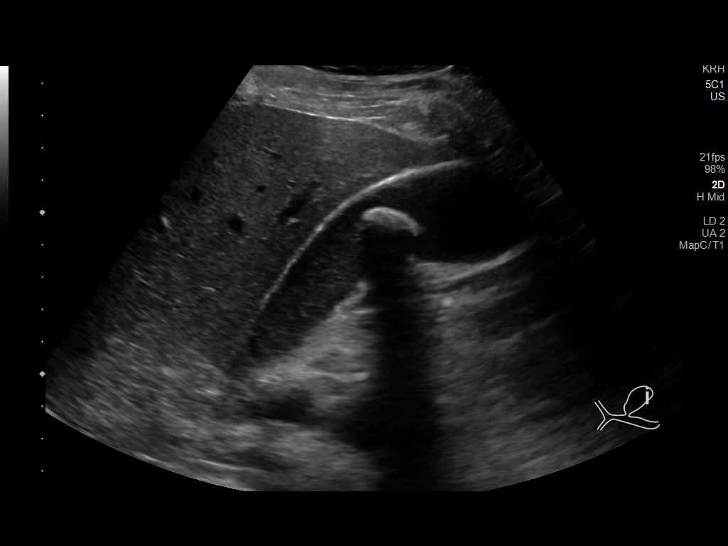
[im 20/80]
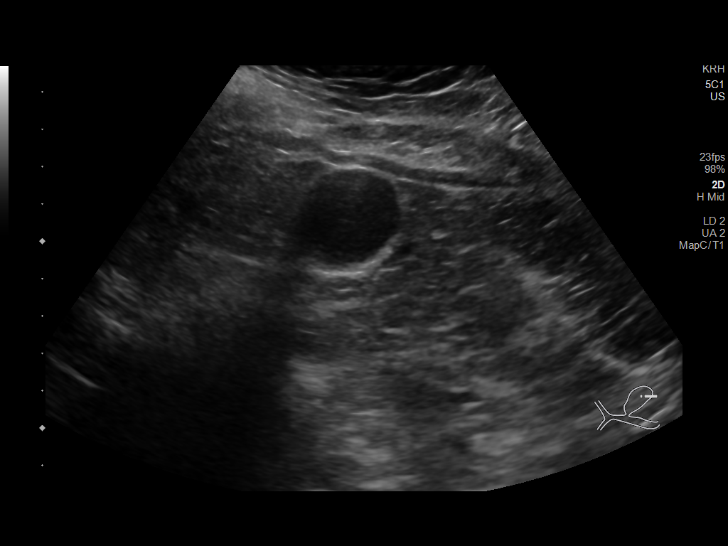
[im 27/80]
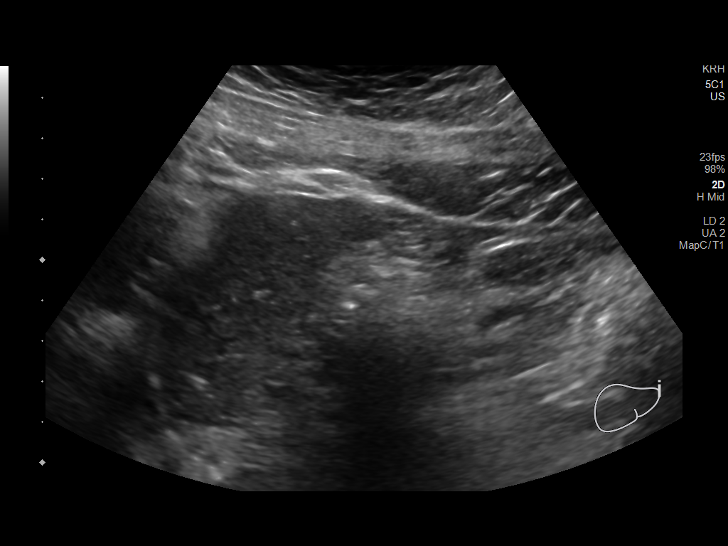
[im 30/80]
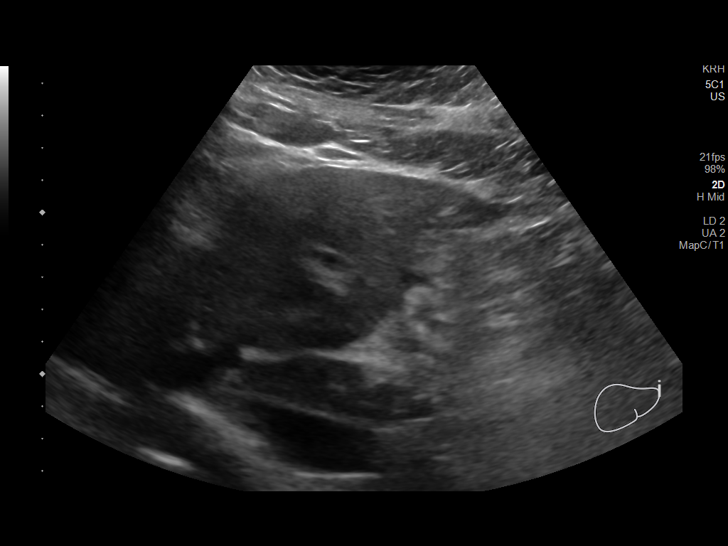
[im 37/80]
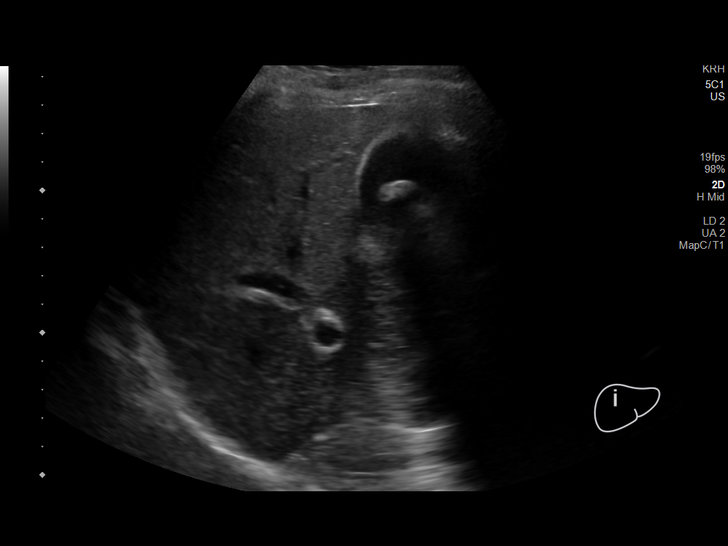
[im 43/80]
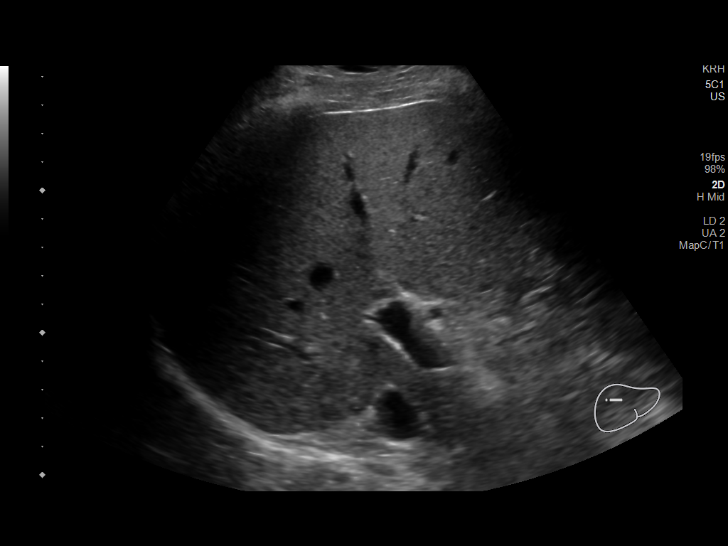
[im 50/80]
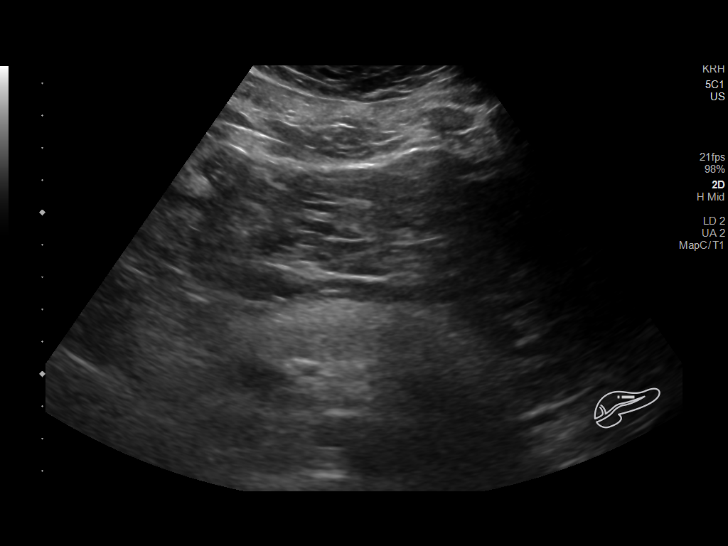
[im 53/80]
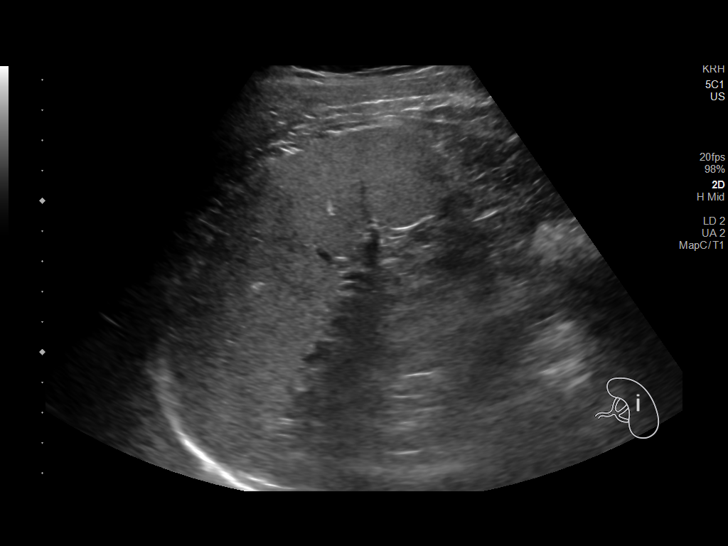
[im 60/80]
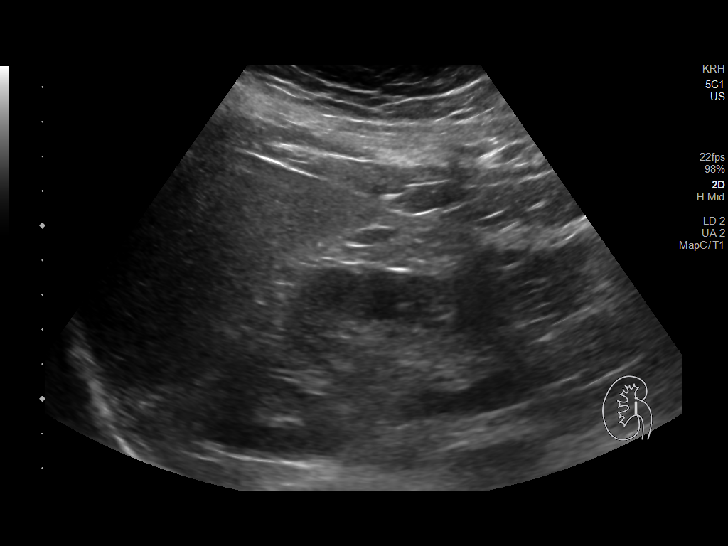
[im 66/80]
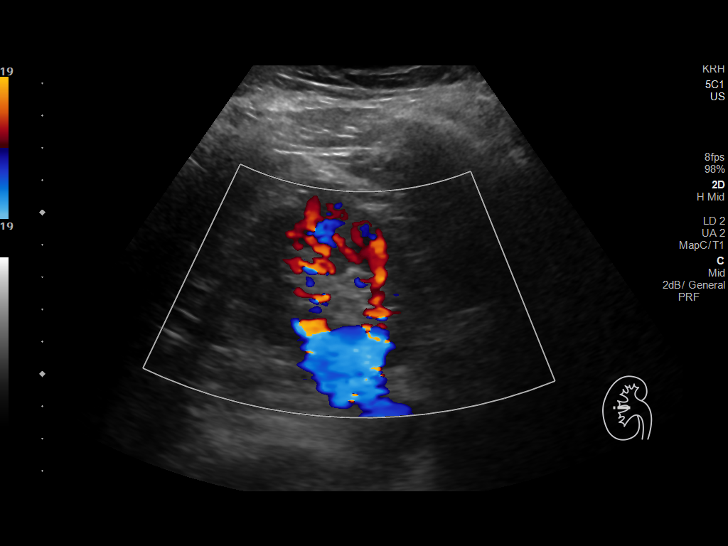
[im 73/80]
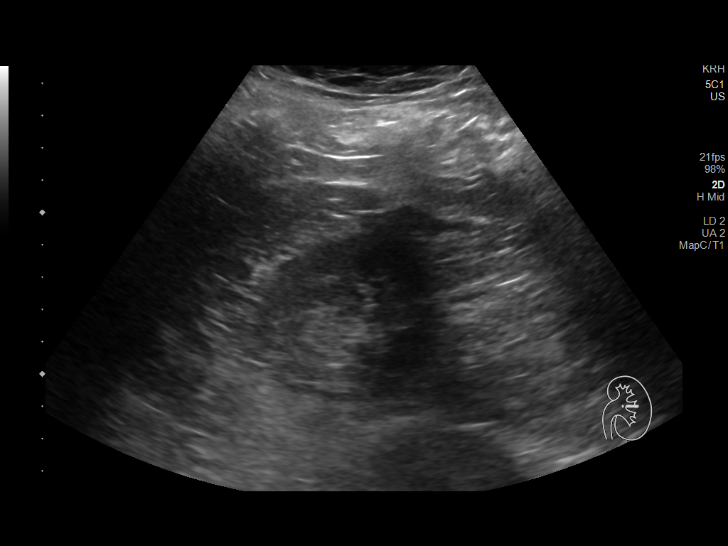
[im 80/80]
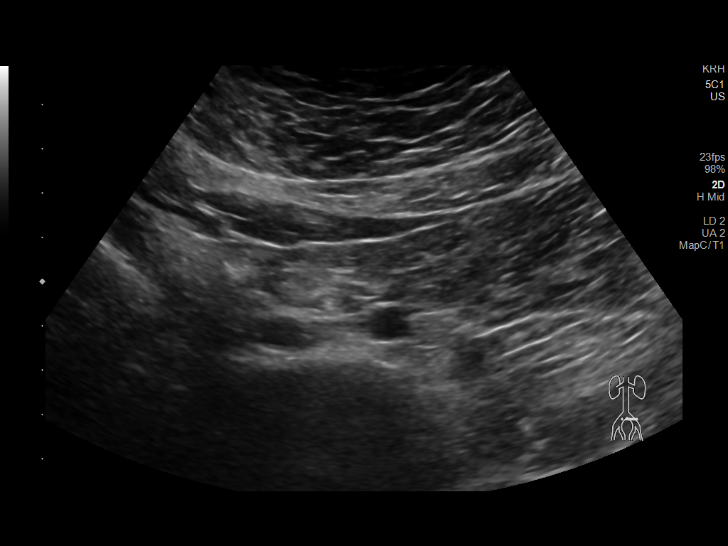

[14 of 25 positions shown; findings below may reference images not displayed]

FINDINGS: Gallbladder: 1.7 cm gallstone is noted. No gallbladder wall
thickening or pericholecystic fluid is noted. Mild amount of sludge
is noted within gallbladder lumen. No sonographic Murphy's sign is
noted.

Common bile duct: Diameter: 4 mm which is within normal limits.

Liver: No focal lesion identified. Within normal limits in
parenchymal echogenicity. Portal vein is patent on color Doppler
imaging with normal direction of blood flow towards the liver.

IVC: No abnormality visualized.

Pancreas: Visualized portion unremarkable.

Spleen: Size and appearance within normal limits.

Right Kidney: Length: 10.8 cm. Echogenicity within normal limits. No
mass or hydronephrosis visualized.

Left Kidney: Length: 10.7 cm. Echogenicity within normal limits. No
mass or hydronephrosis visualized.

Abdominal aorta: No aneurysm visualized.

Other findings: None.
IMPRESSION: Cholelithiasis and sludge is noted without evidence of
cholecystitis. No other abnormality seen in the abdomen.

## 2021-09-20 ENCOUNTER — Telehealth: Payer: BC Managed Care – PPO | Admitting: Emergency Medicine

## 2021-09-20 DIAGNOSIS — J111 Influenza due to unidentified influenza virus with other respiratory manifestations: Secondary | ICD-10-CM | POA: Diagnosis not present

## 2021-09-20 MED ORDER — BALOXAVIR MARBOXIL(40 MG DOSE) 2 X 20 MG PO TBPK: 80.0000 mg | ORAL_TABLET | Freq: Once | ORAL | 0 refills | Status: AC

## 2021-09-20 NOTE — Progress Notes (Signed)
E visit for Flu like symptoms   We are sorry that you are not feeling well.  Here is how we plan to help! Based on what you have shared with me it looks like you may have a respiratory virus that may be influenza.  Influenza or "the flu" is   an infection caused by a respiratory virus. The flu virus is highly contagious and persons who did not receive their yearly flu vaccination may "catch" the flu from close contact.  We have anti-viral medications to treat the viruses that cause this infection. They are not a "cure" and only shorten the course of the infection. These prescriptions are most effective when they are given within the first 2 days of "flu" symptoms. Antiviral medication are indicated if you have a high risk of complications from the flu. You should  also consider an antiviral medication if you are in close contact with someone who is at risk. These medications can help patients avoid complications from the flu  but have side effects that you should know.   Based upon your symptoms and potential risk factors Xofluza Chiropractor) 40mg  tabs (2 tablets) by mouth once (if you weigh above 176 pounds - total of 80 mg)  I recommend using over the counter cold and flu medicines to help manage your symptoms such as Mucinex (generic guaifenesin is okay to use) for congestion and Delsym cough syrup (generic dextromethorphan is okay to use).  I also suggest you use Tylenol or ibuprofen to manage her fever and body aches.  Make sure to drink lots of liquids and stay hydrated.  ANYONE WHO HAS FLU SYMPTOMS SHOULD: Stay home. The flu is highly contagious and going out or to work exposes others! Be sure to drink plenty of fluids. Water is fine as well as fruit juices, sodas and electrolyte beverages. You may want to stay away from caffeine or alcohol. If you are nauseated, try taking small sips of liquids. How do you know if you are getting enough fluid? Your urine should be a pale yellow or almost  colorless. Get rest. Taking a steamy shower or using a humidifier may help nasal congestion and ease sore throat pain. Using a saline nasal spray works much the same way. Cough drops, hard candies and sore throat lozenges may ease your cough. Line up a caregiver. Have someone check on you regularly.   GET HELP RIGHT AWAY IF: You cannot keep down liquids or your medications. You become short of breath Your fell like you are going to pass out or loose consciousness. Your symptoms persist after you have completed your treatment plan MAKE SURE YOU  Understand these instructions. Will watch your condition. Will get help right away if you are not doing well or get worse.  Your e-visit answers were reviewed by a board certified advanced clinical practitioner to complete your personal care plan.  Depending on the condition, your plan could have included both over the counter or prescription medications.  If there is a problem please reply  once you have received a response from your provider.  Your safety is important to .  If you have drug allergies check your prescription carefully.    You can use MyChart to ask questions about today's visit, request a non-urgent call back, or ask for a work or school excuse for 24 hours related to this e-Visit. If it has been greater than 24 hours you will need to follow up with your provider, or enter a new  e-Visit to address those concerns.  You will get an e-mail in the next two days asking about your experience.  I hope that your e-visit has been valuable and will speed your recovery. Thank you for using e-visits.  Provided work note.   I have spent 5 minutes in review of e-visit questionnaire, review and updating patient chart, medical decision making and response to patient.   Rica Mast, PhD, FNP-BC

## 2021-09-21 ENCOUNTER — Encounter: Payer: Self-pay | Admitting: Emergency Medicine

## 2022-01-08 DIAGNOSIS — L2089 Other atopic dermatitis: Secondary | ICD-10-CM | POA: Diagnosis not present

## 2022-01-29 DIAGNOSIS — D485 Neoplasm of uncertain behavior of skin: Secondary | ICD-10-CM | POA: Diagnosis not present

## 2022-01-29 DIAGNOSIS — L729 Follicular cyst of the skin and subcutaneous tissue, unspecified: Secondary | ICD-10-CM | POA: Diagnosis not present

## 2022-02-12 DIAGNOSIS — L729 Follicular cyst of the skin and subcutaneous tissue, unspecified: Secondary | ICD-10-CM | POA: Diagnosis not present

## 2022-06-16 ENCOUNTER — Ambulatory Visit (INDEPENDENT_AMBULATORY_CARE_PROVIDER_SITE_OTHER): Payer: BC Managed Care – PPO | Admitting: Family Medicine

## 2022-06-16 ENCOUNTER — Encounter: Payer: Self-pay | Admitting: Family Medicine

## 2022-06-16 DIAGNOSIS — K625 Hemorrhage of anus and rectum: Secondary | ICD-10-CM

## 2022-06-16 DIAGNOSIS — Z0000 Encounter for general adult medical examination without abnormal findings: Secondary | ICD-10-CM | POA: Diagnosis not present

## 2022-06-16 NOTE — Progress Notes (Signed)
Subjective:    Patient ID: Rickey [name], male    DOB: 20-Nov-1995, 26 y.o.   MRN: [id number]  Chief Complaint  Patient presents with   Annual Exam    HPI Patient is in today for annual preventative exam. No recent febrile illness or acute hospitalizations. His sleep has been disrupted by the fact that he is having to work nights so fatigue has developed. He also notes since having his gallbladder out he is having roughly 3 BMs a day but sometimes has to strain and roughly once a month after these episodes he sees bright red blood on the tissue. No change in appetite, melena, n/v. He stays active. Denies CP/palp/SOB/HA/congestion/fevers or GU c/o. Taking meds as prescribed   Past Medical History:  Diagnosis Date   Acne    ADD (attention deficit disorder)    Autism spectrum disorder 06/24/2015   Autistic disorder    Family history of adverse reaction to anesthesia    Father and brother woke up during surgery   GERD (gastroesophageal reflux disease)    Preventative health care 06/24/2015    Past Surgical History:  Procedure Laterality Date   ADENOIDECTOMY AND MYRINGOTOMY WITH TUBE PLACEMENT     CHOLECYSTECTOMY N/A 11/20/2020   Procedure: LAPAROSCOPIC CHOLECYSTECTOMY;  Surgeon: Harriette Bouillon, MD;  Location: [facility] OR;  Service: General;  Laterality: N/A;  LAPAROSCOPIC CHOLECYSTECTOMY   WISDOM TOOTH EXTRACTION      Family History  Problem Relation Age of Onset   Diabetes Father    Kidney disease Brother        kidney stones   Diabetes Paternal Grandmother    Heart disease Paternal Grandmother    Diabetes Paternal Grandfather    Heart disease Paternal Grandfather     Social History   Socioeconomic History   Marital status: Single    Spouse name: Not on file   Number of children: Not on file   Years of education: Not on file   Highest education level: Not on file  Occupational History   Occupation: Consulting civil engineer at [employer]  Tobacco Use   Smoking status: Never    Smokeless tobacco: Never  Vaping Use   Vaping Use: Never used  Substance and Sexual Activity   Alcohol use: Yes    Alcohol/week: 0.0 standard drinks of alcohol    Comment: sporatic - Beer- Liquior  (Beer 3-4, Liquor 4)   Drug use: No   Sexual activity: Never    Comment: lives with parents and brother,  Other Topics Concern   Not on file  Social History Narrative   Not on file   Social Determinants of Health   Financial Resource Strain: Not on file  Food Insecurity: Not on file  Transportation Needs: Not on file  Physical Activity: Not on file  Stress: Not on file  Social Connections: Not on file  Intimate Partner Violence: Not on file    Outpatient Medications Prior to Visit  Medication Sig Dispense Refill   omeprazole (PRILOSEC) 20 MG capsule TAKE 1 CAPSULE BY MOUTH DAILY (Patient not taking: Reported on 06/16/2022) 30 capsule 1   No facility-administered medications prior to visit.    No Known Allergies  Review of Systems  Constitutional:  Positive for malaise/fatigue. Negative for chills and fever.  HENT:  Negative for congestion and hearing loss.   Eyes:  Negative for discharge.  Respiratory:  Negative for cough, sputum production and shortness of breath.   Cardiovascular:  Negative for chest pain, palpitations and leg  swelling.  Gastrointestinal:  Positive for blood in stool and constipation. Negative for abdominal pain, diarrhea, heartburn, melena, nausea and vomiting.  Genitourinary:  Negative for dysuria, frequency, hematuria and urgency.  Musculoskeletal:  Negative for back pain, falls and myalgias.  Skin:  Negative for rash.  Neurological:  Negative for dizziness, sensory change, loss of consciousness, weakness and headaches.  Endo/Heme/Allergies:  Negative for environmental allergies. Does not bruise/bleed easily.  Psychiatric/Behavioral:  Negative for depression and suicidal ideas. The patient is not nervous/anxious and does not have insomnia.         Objective:    Physical Exam  BP 136/72   Pulse 86   Temp 98 F (36.7 C) (Oral)   Resp 16   Ht 6\' 1"  (1.854 m)   Wt 199 lb 6 oz (90.4 kg)   SpO2 98%   BMI 26.30 kg/m  Wt Readings from Last 3 Encounters:  06/16/22 199 lb 6 oz (90.4 kg)  06/10/21 197 lb 6.4 oz (89.5 kg)  11/20/20 175 lb (79.4 kg)    Diabetic Foot Exam - Simple   No data filed    Lab Results  Component Value Date   WBC 9.2 06/10/2021   HGB 16.8 06/10/2021   HCT 48.5 06/10/2021   PLT 232.0 06/10/2021   GLUCOSE 80 06/10/2021   CHOL 141 06/10/2021   TRIG 128.0 06/10/2021   HDL 49.90 06/10/2021   LDLCALC 65 06/10/2021   ALT 29 06/10/2021   AST 20 06/10/2021   NA 140 06/10/2021   K 3.9 06/10/2021   CL 102 06/10/2021   CREATININE 0.98 06/10/2021   BUN 11 06/10/2021   CO2 28 06/10/2021   TSH 1.52 06/10/2021   HGBA1C 5.0 06/10/2021    Lab Results  Component Value Date   TSH 1.52 06/10/2021   Lab Results  Component Value Date   WBC 9.2 06/10/2021   HGB 16.8 06/10/2021   HCT 48.5 06/10/2021   MCV 86.7 06/10/2021   PLT 232.0 06/10/2021   Lab Results  Component Value Date   NA 140 06/10/2021   K 3.9 06/10/2021   CO2 28 06/10/2021   GLUCOSE 80 06/10/2021   BUN 11 06/10/2021   CREATININE 0.98 06/10/2021   BILITOT 0.8 06/10/2021   ALKPHOS 88 06/10/2021   AST 20 06/10/2021   ALT 29 06/10/2021   PROT 7.8 06/10/2021   ALBUMIN 5.0 06/10/2021   CALCIUM 10.5 06/10/2021   ANIONGAP 13 11/20/2020   GFR 108.09 06/10/2021   Lab Results  Component Value Date   CHOL 141 06/10/2021   Lab Results  Component Value Date   HDL 49.90 06/10/2021   Lab Results  Component Value Date   LDLCALC 65 06/10/2021   Lab Results  Component Value Date   TRIG 128.0 06/10/2021   Lab Results  Component Value Date   CHOLHDL 3 06/10/2021   Lab Results  Component Value Date   HGBA1C 5.0 06/10/2021       Assessment & Plan:   Problem List Items Addressed This Visit     Preventative health care     Patient encouraged to maintain heart healthy diet, regular exercise, adequate sleep. Consider daily probiotics. Take medications as prescribed. Labs reviewed.      Rectal bleeding    Happens roughly weekly. Bright red blood on tissue usually after straining. No nausea or vomiting. Referred to gastroenterology for further evaluation.      Relevant Orders   Ambulatory referral to Gastroenterology    I am  having Zara Council maintain his omeprazole.  No orders of the defined types were placed in this encounter.    Danise Edge, MD

## 2022-06-16 NOTE — Assessment & Plan Note (Signed)
Happens roughly weekly. Bright red blood on tissue usually after straining. No nausea or vomiting. Referred to gastroenterology for further evaluation.

## 2022-06-16 NOTE — Patient Instructions (Addendum)
Yerba Matte tea do not drink  Magnesium Glycinate 200-400 mg at time L Tryptophan capsules for sleep   Preventive Care 25-25 Years Old, Male Preventive care refers to lifestyle choices and visits with your health care provider that can promote health and wellness. Preventive care visits are also called wellness exams. What can I expect for my preventive care visit? Counseling During your preventive care visit, your health care provider may ask about your: Medical history, including: Past medical problems. Family medical history. Current health, including: Emotional well-being. Home life and relationship well-being. Sexual activity. Lifestyle, including: Alcohol, nicotine or tobacco, and drug use. Access to firearms. Diet, exercise, and sleep habits. Safety issues such as seatbelt and bike helmet use. Sunscreen use. Work and work Astronomer. Physical exam Your health care provider may check your: Height and weight. These may be used to calculate your BMI (body mass index). BMI is a measurement that tells if you are at a healthy weight. Waist circumference. This measures the distance around your waistline. This measurement also tells if you are at a healthy weight and may help predict your risk of certain diseases, such as type 2 diabetes and high blood pressure. Heart rate and blood pressure. Body temperature. Skin for abnormal spots. What immunizations do I need?  Vaccines are usually given at various ages, according to a schedule. Your health care provider will recommend vaccines for you based on your age, medical history, and lifestyle or other factors, such as travel or where you work. What tests do I need? Screening Your health care provider may recommend screening tests for certain conditions. This may include: Lipid and cholesterol levels. Diabetes screening. This is done by checking your blood sugar (glucose) after you have not eaten for a while (fasting). Hepatitis B  test. Hepatitis C test. HIV (human immunodeficiency virus) test. STI (sexually transmitted infection) testing, if you are at risk. Talk with your health care provider about your test results, treatment options, and if necessary, the need for more tests. Follow these instructions at home: Eating and drinking  Eat a healthy diet that includes fresh fruits and vegetables, whole grains, lean protein, and low-fat dairy products. Drink enough fluid to keep your urine pale yellow. Take vitamin and mineral supplements as recommended by your health care provider. Do not drink alcohol if your health care provider tells you not to drink. If you drink alcohol: Limit how much you have to 0-2 drinks a day. Know how much alcohol is in your drink. In the [place] one drink equals one 12 oz bottle of beer (355 mL), one 5 oz glass of wine (148 mL), or one 1 oz glass of hard liquor (44 mL). Lifestyle Brush your teeth every morning and night with fluoride toothpaste. Floss one time each day. Exercise for at least 30 minutes 5 or more days each week. Do not use any products that contain nicotine or tobacco. These products include cigarettes, chewing tobacco, and vaping devices, such as e-cigarettes. If you need help quitting, ask your health care provider. Do not use drugs. If you are sexually active, practice safe sex. Use a condom or other form of protection to prevent STIs. Find healthy ways to manage stress, such as: Meditation, yoga, or listening to music. Journaling. Talking to a trusted person. Spending time with friends and family. Minimize exposure to UV radiation to reduce your risk of skin cancer. Safety Always wear your seat belt while driving or riding in a vehicle. Do not drive: If you have  been drinking alcohol. Do not ride with someone who has been drinking. If you have been using any mind-altering substances or drugs. While texting. When you are tired or distracted. Wear a helmet and  other protective equipment during sports activities. If you have firearms in your house, make sure you follow all gun safety procedures. Seek help if you have been physically or sexually abused. What's next? Go to your health care provider once a year for an annual wellness visit. Ask your health care provider how often you should have your eyes and teeth checked. Stay up to date on all vaccines. This information is not intended to replace advice given to you by your health care provider. Make sure you discuss any questions you have with your health care provider. Document Revised: 04/10/2021 Document Reviewed: 04/10/2021 Elsevier Patient Education  2023 [employer].

## 2022-06-16 NOTE — Assessment & Plan Note (Signed)
Patient encouraged to maintain heart healthy diet, regular exercise, adequate sleep. Consider daily probiotics. Take medications as prescribed. Labs reviewed 

## 2022-07-30 ENCOUNTER — Encounter: Payer: Self-pay | Admitting: Gastroenterology

## 2022-09-03 ENCOUNTER — Encounter: Payer: Self-pay | Admitting: Gastroenterology

## 2022-09-03 ENCOUNTER — Ambulatory Visit (INDEPENDENT_AMBULATORY_CARE_PROVIDER_SITE_OTHER): Payer: BC Managed Care – PPO | Admitting: Gastroenterology

## 2022-09-03 VITALS — BP 140/88 | HR 100 | Ht 73.0 in | Wt 200.2 lb

## 2022-09-03 DIAGNOSIS — R1013 Epigastric pain: Secondary | ICD-10-CM | POA: Diagnosis not present

## 2022-09-03 DIAGNOSIS — K921 Melena: Secondary | ICD-10-CM

## 2022-09-03 DIAGNOSIS — K625 Hemorrhage of anus and rectum: Secondary | ICD-10-CM | POA: Diagnosis not present

## 2022-09-03 DIAGNOSIS — K649 Unspecified hemorrhoids: Secondary | ICD-10-CM

## 2022-09-03 DIAGNOSIS — R194 Change in bowel habit: Secondary | ICD-10-CM | POA: Diagnosis not present

## 2022-09-03 DIAGNOSIS — K219 Gastro-esophageal reflux disease without esophagitis: Secondary | ICD-10-CM

## 2022-09-03 MED ORDER — SUTAB 1479-225-188 MG PO TABS: 1.0000 | ORAL_TABLET | Freq: Once | ORAL | 0 refills | Status: AC

## 2022-09-03 MED ORDER — HYDROCORTISONE (PERIANAL) 2.5 % EX CREA: 1.0000 | TOPICAL_CREAM | Freq: Two times a day (BID) | CUTANEOUS | 1 refills | Status: AC | PRN

## 2022-09-03 NOTE — Progress Notes (Unsigned)
     09/03/2022 Rickey [name] [id number] 08-Aug-1996   HISTORY OF PRESENT ILLNESS: ***         Past Medical History:  Diagnosis Date   Acne    ADD (attention deficit disorder)    Autism spectrum disorder 06/24/2015   Autistic disorder    Family history of adverse reaction to anesthesia    Father and brother woke up during surgery   GERD (gastroesophageal reflux disease)    Preventative health care 06/24/2015   Past Surgical History:  Procedure Laterality Date   ADENOIDECTOMY AND MYRINGOTOMY WITH TUBE PLACEMENT     CHOLECYSTECTOMY N/A 11/20/2020   Procedure: LAPAROSCOPIC CHOLECYSTECTOMY;  Surgeon: Harriette Bouillon, MD;  Location: [facility] OR;  Service: General;  Laterality: N/A;  LAPAROSCOPIC CHOLECYSTECTOMY   WISDOM TOOTH EXTRACTION      reports that he has never smoked. He has never used smokeless tobacco. He reports current alcohol use. He reports that he does not use drugs. family history includes Diabetes in his father, paternal grandfather, and paternal grandmother; Heart disease in his paternal grandfather and paternal grandmother; Kidney disease in his brother. No Known Allergies    Outpatient Encounter Medications as of 09/03/2022  Medication Sig   omeprazole (PRILOSEC) 20 MG capsule TAKE 1 CAPSULE BY MOUTH DAILY   No facility-administered encounter medications on file as of 09/03/2022.     REVIEW OF SYSTEMS  : All other systems reviewed and negative except where noted in the History of Present Illness.   PHYSICAL EXAM: BP (!) 140/88   Pulse 100   Ht 6\' 1"  (1.854 m)   Wt 200 lb 3.2 oz (90.8 kg)   SpO2 98%   BMI 26.41 kg/m  General: Well developed white male in no acute distress Head: Normocephalic and atraumatic Eyes:  sclerae anicteric,conjunctive pink. Ears: Normal auditory acuity Neck: Supple, no masses.  Lungs: Clear throughout to auscultation Heart: Regular rate and rhythm Abdomen: Soft, nontender, non distended. No masses or hepatomegaly noted.  Normal bowel sounds Rectal: Musculoskeletal: Symmetrical with no gross deformities  Skin: No lesions on visible extremities Extremities: No edema  Neurological: Alert oriented x 4, grossly non-focal Psychological:  Alert and cooperative. Normal mood and affect  ASSESSMENT AND PLAN:    CC:  , MD

## 2022-09-03 NOTE — Patient Instructions (Addendum)
If you are age 25 or older, your body mass index should be between 23-30. Your Body mass index is 26.41 kg/m. If this is out of the aforementioned range listed, please consider follow up with your Primary Care Provider.  If you are age 78 or younger, your body mass index should be between 19-25. Your Body mass index is 26.41 kg/m. If this is out of the aformentioned range listed, please consider follow up with your Primary Care Provider.   ________________________________________________________   Rickey [name] have been scheduled for an endoscopy and colonoscopy. Please follow the written instructions given to you at your visit today. Please pick up your prep supplies at the pharmacy within the next 1-3 days. If you use inhalers (even only as needed), please bring them with you on the day of your procedure.  We have sent the following medications to your pharmacy for you to pick up at your convenience: Hydrocortisone cream 2.5%: use twice a day as needed  Thank you for entrusting me with your care and for choosing [facility], Doug Sou, P.A. - C.

## 2022-09-04 ENCOUNTER — Encounter: Payer: Self-pay | Admitting: Gastroenterology

## 2022-09-04 DIAGNOSIS — K921 Melena: Secondary | ICD-10-CM | POA: Insufficient documentation

## 2022-09-04 DIAGNOSIS — R194 Change in bowel habit: Secondary | ICD-10-CM | POA: Insufficient documentation

## 2022-09-04 DIAGNOSIS — R1013 Epigastric pain: Secondary | ICD-10-CM | POA: Insufficient documentation

## 2022-09-04 NOTE — Progress Notes (Signed)
Agree with the assessment and plan as outlined by Jessica Zehr, PA-C.  Royce Stegman E. Keierra Nudo, MD  [facility]  

## 2022-09-10 ENCOUNTER — Encounter: Payer: Self-pay | Admitting: Gastroenterology

## 2022-09-10 ENCOUNTER — Ambulatory Visit (AMBULATORY_SURGERY_CENTER): Payer: BC Managed Care – PPO | Admitting: Gastroenterology

## 2022-09-10 VITALS — BP 114/70 | HR 59 | Temp 98.2°F | Resp 14 | Ht 73.0 in | Wt 200.0 lb

## 2022-09-10 DIAGNOSIS — K640 First degree hemorrhoids: Secondary | ICD-10-CM

## 2022-09-10 DIAGNOSIS — K259 Gastric ulcer, unspecified as acute or chronic, without hemorrhage or perforation: Secondary | ICD-10-CM | POA: Diagnosis not present

## 2022-09-10 DIAGNOSIS — K921 Melena: Secondary | ICD-10-CM | POA: Diagnosis not present

## 2022-09-10 DIAGNOSIS — K2950 Unspecified chronic gastritis without bleeding: Secondary | ICD-10-CM

## 2022-09-10 DIAGNOSIS — K625 Hemorrhage of anus and rectum: Secondary | ICD-10-CM | POA: Diagnosis not present

## 2022-09-10 DIAGNOSIS — R1013 Epigastric pain: Secondary | ICD-10-CM

## 2022-09-10 DIAGNOSIS — R194 Change in bowel habit: Secondary | ICD-10-CM

## 2022-09-10 MED ORDER — OMEPRAZOLE 20 MG PO CPDR: 20.0000 mg | DELAYED_RELEASE_CAPSULE | Freq: Two times a day (BID) | ORAL | 0 refills | Status: AC

## 2022-09-10 MED ORDER — SODIUM CHLORIDE 0.9 % IV SOLN: 500.0000 mL | INTRAVENOUS | Status: DC

## 2022-09-10 NOTE — Op Note (Signed)
[facility] Patient Name: Rickey [name] Procedure Date: 09/10/2022 2:05 PM MRN: [id number] Endoscopist: Lorin Picket E. Tomasa Rand , MD, [id number] Age: 26 Referring MD:  Date of Birth: 09-Oct-1996 Gender: Male Account #: [id number] Procedure:                Colonoscopy Indications:              Hematochezia Medicines:                Monitored Anesthesia Care Procedure:                Pre-Anesthesia Assessment:                           - Prior to the procedure, a History and Physical                            was performed, and patient medications and                            allergies were reviewed. The patient's tolerance of                            previous anesthesia was also reviewed. The risks                            and benefits of the procedure and the sedation                            options and risks were discussed with the patient.                            All questions were answered, and informed consent                            was obtained. Prior Anticoagulants: The patient has                            taken no anticoagulant or antiplatelet agents. ASA                            Grade Assessment: II - A patient with mild systemic                            disease. After reviewing the risks and benefits,                            the patient was deemed in satisfactory condition to                            undergo the procedure.                           After obtaining informed consent, the colonoscope  was passed under direct vision. Throughout the                            procedure, the patient's blood pressure, pulse, and                            oxygen saturations were monitored continuously. The                            Colonoscope was introduced through the anus and                            advanced to the the terminal ileum, with                            identification of the appendiceal orifice and IC                             valve. The colonoscopy was performed without                            difficulty. The patient tolerated the procedure                            well. The quality of the bowel preparation was                            adequate. The terminal ileum, ileocecal valve,                            appendiceal orifice, and rectum were photographed.                            The bowel preparation used was SUPREP via split                            dose instruction. Scope In: 2:21:04 PM Scope Out: 2:31:43 PM Scope Withdrawal Time: 0 hours 8 minutes 55 seconds  Total Procedure Duration: 0 hours 10 minutes 39 seconds  Findings:                 The perianal and digital rectal examinations were                            normal. Pertinent negatives include normal                            sphincter tone and no palpable rectal lesions.                           The colon (entire examined portion) appeared normal.                           The terminal ileum appeared normal.  Non-bleeding internal hemorrhoids were found during                            endoscopy. The hemorrhoids were Grade I (internal                            hemorrhoids that do not prolapse). Rectal                            retroflexion was attempted multiple times but was                            unsuccessful. Complications:            No immediate complications. Estimated Blood Loss:     Estimated blood loss: none. Impression:               - The entire examined colon is normal.                           - The examined portion of the ileum was normal.                           - Non-bleeding internal hemorrhoids. This is the                            source of the patient's hematochezia.                           - No specimens collected. Recommendation:           - Patient has a contact number available for                            emergencies. The signs and symptoms  of potential                            delayed complications were discussed with the                            patient. Return to normal activities tomorrow.                            Written discharge instructions were provided to the                            patient.                           - Resume previous diet.                           - Continue present medications.                           - Repeat colonoscopy at age 89 for screening  purposes.                           - Recommend daily fiber supplementation with                            Metamucil to optimize stool consistency and reduce                            hemorrhoidal bleeding. [name] E. Tomasa Randunningham, MD 09/10/2022 2:42:28 PM This report has been signed electronically.

## 2022-09-10 NOTE — Progress Notes (Signed)
Called to room to assist during endoscopic procedure.  Patient ID and intended procedure confirmed with present staff. Received instructions for my participation in the procedure from the performing physician.  

## 2022-09-10 NOTE — Op Note (Signed)
[facility] Patient Name: Rickey [name] Procedure Date: 09/10/2022 2:05 PM MRN: [id number] Endoscopist: Lorin Picket E. Tomasa Rand , MD, [id number] Age: 26 Referring MD:  Date of Birth: [birth year] Gender: Male Account #: [id number] Procedure:                Upper GI endoscopy Indications:              Epigastric abdominal pain, Melena Medicines:                Monitored Anesthesia Care Procedure:                Pre-Anesthesia Assessment:                           - Prior to the procedure, a History and Physical                            was performed, and patient medications and                            allergies were reviewed. The patient's tolerance of                            previous anesthesia was also reviewed. The risks                            and benefits of the procedure and the sedation                            options and risks were discussed with the patient.                            All questions were answered, and informed consent                            was obtained. Prior Anticoagulants: The patient has                            taken no anticoagulant or antiplatelet agents. ASA                            Grade Assessment: II - A patient with mild systemic                            disease. After reviewing the risks and benefits,                            the patient was deemed in satisfactory condition to                            undergo the procedure.                           After obtaining informed consent, the endoscope was  passed under direct vision. Throughout the                            procedure, the patient's blood pressure, pulse, and                            oxygen saturations were monitored continuously. The                            GIF [id number] was introduced through the                            mouth, and advanced to the third part of duodenum.                            The upper GI  endoscopy was accomplished without                            difficulty. The patient tolerated the procedure                            well. Scope In: Scope Out: Findings:                 The examined esophagus was normal.                           The gastroesophageal flap valve was visualized                            endoscopically and classified as Hill Grade III                            (minimal fold, loose to endoscope, hiatal hernia                            likely).                           Many non-bleeding superficial gastric ulcers with                            adherent clot were found in the gastric antrum. The                            largest lesion was 4 mm in largest dimension.                            Biopsies were taken with a cold forceps for                            Helicobacter pylori testing. Estimated blood loss                            was minimal.  The exam of the stomach was otherwise normal.                           The examined duodenum was normal. Complications:            No immediate complications. Estimated Blood Loss:     Estimated blood loss was minimal. Impression:               - Normal esophagus.                           - Gastroesophageal flap valve classified as Hill                            Grade III (minimal fold, loose to endoscope, hiatal                            hernia likely).                           - Non-bleeding gastric ulcers with adherent clot.                            Biopsied. These are the likely source of the                            patient's epigastric pain and melenic stools.                           - Normal examined duodenum. Recommendation:           - Patient has a contact number available for                            emergencies. The signs and symptoms of potential                            delayed complications were discussed with the                             patient. Return to normal activities tomorrow.                            Written discharge instructions were provided to the                            patient.                           - Resume previous diet.                           - Continue present medications.                           - Await pathology results.                           -  Repeat upper endoscopy in 8 weeks to check                            healing.                           - Use Prilosec (omeprazole) 20 mg PO BID for 8                            weeks to help heal gastric ulcers.                           - Avoid NSAIDs. Use Tylenol preferentially for                            headaches/pain reliever Calinda Stockinger E. Candis Schatz, MD 09/10/2022 2:38:14 PM This report has been signed electronically.

## 2022-09-10 NOTE — Progress Notes (Signed)
History and Physical Interval Note:  09/10/2022 2:03 PM  Zara Council  has presented today for endoscopic procedure(s), with the diagnosis of  Encounter Diagnoses  Name Primary?   Rectal bleeding Yes   Change in bowel habits    Abdominal pain, epigastric    Black stool   .  The various methods of evaluation and treatment have been discussed with the patient and/or family. After consideration of risks, benefits and other options for treatment, the patient has consented to  the endoscopic procedure(s).   The patient's history has been reviewed, patient examined, no change in status, stable for endoscopic procedure(s).  I have reviewed the patient's chart and labs.  Questions were answered to the patient's satisfaction.     Mare Ludtke E. Tomasa Rand, MD [facility]

## 2022-09-10 NOTE — Progress Notes (Signed)
Pt's states no medical or surgical changes since previsit or office visit. 

## 2022-09-10 NOTE — Progress Notes (Signed)
Vss nad trans to pacu °

## 2022-09-10 NOTE — Patient Instructions (Addendum)
Take your omeprazole twice daily a half hour before breakfast and supper for 2 months.  Try to avoid ibuprofen and aspirin.  Use tylenol if you have pain.  Ad all of the handouts given to you by your recovery room nurse.  We will see you in January for your repeat endoscopy. Pleae, read your instructions for your endoscope in January.  YOU HAD AN ENDOSCOPIC PROCEDURE TODAY AT THE  ENDOSCOPY CENTER:   Refer to the procedure report that was given to you for any specific questions about what was found during the examination.  If the procedure report does not answer your questions, please call your gastroenterologist to clarify.  If you requested that your care partner not be given the details of your procedure findings, then the procedure report has been included in a sealed envelope for you to review at your convenience later.  YOU SHOULD EXPECT: Some feelings of bloating in the abdomen. Passage of more gas than usual.  Walking can help get rid of the air that was put into your GI tract during the procedure and reduce the bloating. If you had a lower endoscopy (such as a colonoscopy or flexible sigmoidoscopy) you may notice spotting of blood in your stool or on the toilet paper. If you underwent a bowel prep for your procedure, you may not have a normal bowel movement for a few days.  Please Note:  You might notice some irritation and congestion in your nose or some drainage.  This is from the oxygen used during your procedure.  There is no need for concern and it should clear up in a day or so.  SYMPTOMS TO REPORT IMMEDIATELY:  Following lower endoscopy (colonoscopy or flexible sigmoidoscopy):  Excessive amounts of blood in the stool  Significant tenderness or worsening of abdominal pains  Swelling of the abdomen that is new, acute  Fever of 100F or higher  Following upper endoscopy (EGD)  Vomiting of blood or coffee ground material  New chest pain or pain under the shoulder blades  Painful  or persistently difficult swallowing  New shortness of breath  Fever of 100F or higher  Black, tarry-looking stools  For urgent or emergent issues, a gastroenterologist can be reached at any hour by calling [phone]. Do not use MyChart messaging for urgent concerns.    DIET:  We do recommend a small meal at first, but then you may proceed to your regular diet.  Drink plenty of fluids but you should avoid alcoholic beverages for 24 hours.  ACTIVITY:  You should plan to take it easy for the rest of today and you should NOT DRIVE or use heavy machinery until tomorrow (because of the sedation medicines used during the test).    FOLLOW UP: Our staff will call the number listed on your records the next business day following your procedure.  We will call around 7:15- 8:00 am to check on you and address any questions or concerns that you may have regarding the information given to you following your procedure. If we do not reach you, we will leave a message.     If any biopsies were taken you will be contacted by phone or by letter within the next 1-3 weeks.  Please call us at [phone] if you have not heard about the biopsies in 3 weeks.    SIGNATURES/CONFIDENTIALITY: You and/or your care partner have signed paperwork which will be entered into your electronic medical record.  These signatures attest to the  fact that that the information above on your After Visit Summary has been reviewed and is understood.  Full responsibility of the confidentiality of this discharge information lies with you and/or your care-partner.

## 2022-09-11 ENCOUNTER — Telehealth: Payer: Self-pay

## 2022-09-11 NOTE — Telephone Encounter (Signed)
  Follow up Call-     09/10/2022    1:37 PM  Call back number  Post procedure Call Back phone  # [phone]  Permission to leave phone message Yes     Patient questions:  Do you have a fever, pain , or abdominal swelling? No. Pain Score  0 *  Have you tolerated food without any problems? Yes.    Have you been able to return to your normal activities? Yes.    Do you have any questions about your discharge instructions: Diet   No. Medications  No. Follow up visit  No.  Do you have questions or concerns about your Care? No.  Actions: * If pain score is 4 or above: No action needed, pain <4.

## 2022-09-20 NOTE — Progress Notes (Signed)
Rickey [name],  The biopsies taken from your stomach were notable for mild chronic gastritis (inflammation) which is a common finding, but there was no evidence of Helicobacter pylori infection. The inflammation seen is most likely secondary to medications (NSAIDs).  Please take the omeprazole twice daily as instructed, avoid NSAIDs and repeat EGD in 8 weeks to check for healing of ulcers.

## 2022-11-05 ENCOUNTER — Encounter: Payer: Self-pay | Admitting: Gastroenterology

## 2022-11-07 ENCOUNTER — Ambulatory Visit (AMBULATORY_SURGERY_CENTER): Payer: BC Managed Care – PPO | Admitting: Gastroenterology

## 2022-11-07 ENCOUNTER — Encounter: Payer: Self-pay | Admitting: Gastroenterology

## 2022-11-07 VITALS — BP 113/66 | HR 63 | Temp 98.1°F | Resp 11 | Ht 73.0 in | Wt 200.0 lb

## 2022-11-07 DIAGNOSIS — Z8711 Personal history of peptic ulcer disease: Secondary | ICD-10-CM

## 2022-11-07 DIAGNOSIS — R1013 Epigastric pain: Secondary | ICD-10-CM

## 2022-11-07 MED ORDER — SODIUM CHLORIDE 0.9 % IV SOLN: 500.0000 mL | Freq: Once | INTRAVENOUS | Status: DC

## 2022-11-07 NOTE — Progress Notes (Signed)
[facility] History and Physical   Primary Care Physician:  Mosie Lukes, MD   Reason for Procedure:   Follow up gastric ulcers  Plan:    EGD     HPI: Rickey [name] is a 27 y.o. male undergoing repeat EGD after he was found to have multiple small gastric antral ulcers with adherent clots in November.  He has been taking omeprazole twice daily.  Infrequent NSAID use, <1/month.    Past Medical History:  Diagnosis Date   Acne    ADD (attention deficit disorder)    Autism spectrum disorder 06/24/2015   Autistic disorder    Family history of adverse reaction to anesthesia    Father and brother woke up during surgery   GERD (gastroesophageal reflux disease)    Preventative health care 06/24/2015    Past Surgical History:  Procedure Laterality Date   ADENOIDECTOMY AND MYRINGOTOMY WITH TUBE PLACEMENT     CHOLECYSTECTOMY N/A 11/20/2020   Procedure: LAPAROSCOPIC CHOLECYSTECTOMY;  Surgeon: Erroll Luna, MD;  Location: [facility];  Service: General;  Laterality: N/A;  LAPAROSCOPIC CHOLECYSTECTOMY   WISDOM TOOTH EXTRACTION      Prior to Admission medications   Medication Sig Start Date End Date Taking? Authorizing Provider  omeprazole (PRILOSEC) 20 MG capsule Take 1 capsule (20 mg total) by mouth 2 (two) times daily before a meal. 09/10/22  Yes Daryel November, MD  hydrocortisone (ANUSOL-HC) 2.5 % rectal cream Place 1 Application rectally 2 (two) times daily as needed for hemorrhoids or anal itching. 09/03/22   Zehr, Laban Emperor, PA-C    Current Outpatient Medications  Medication Sig Dispense Refill   omeprazole (PRILOSEC) 20 MG capsule Take 1 capsule (20 mg total) by mouth 2 (two) times daily before a meal. 112 capsule 0   hydrocortisone (ANUSOL-HC) 2.5 % rectal cream Place 1 Application rectally 2 (two) times daily as needed for hemorrhoids or anal itching. 30 g 1   Current Facility-Administered Medications  Medication Dose Route Frequency Provider Last Rate Last  Admin   0.9 %  sodium chloride infusion  500 mL Intravenous Once Daryel November, MD        Allergies as of 11/07/2022   (No Known Allergies)    Family History  Problem Relation Age of Onset   Diabetes Father    Kidney disease Brother        kidney stones   Diabetes Paternal Grandmother    Heart disease Paternal Grandmother    Diabetes Paternal Grandfather    Heart disease Paternal Grandfather    Esophageal cancer Neg Hx    Liver disease Neg Hx    Colon cancer Neg Hx     Social History   Socioeconomic History   Marital status: Single    Spouse name: Not on file   Number of children: 0   Years of education: Not on file   Highest education level: Not on file  Occupational History   Occupation: Ship broker at [employer]   Occupation: mold maker  Tobacco Use   Smoking status: Never   Smokeless tobacco: Never  Vaping Use   Vaping Use: Never used  Substance and Sexual Activity   Alcohol use: Yes    Alcohol/week: 0.0 standard drinks of alcohol    Comment: sporatic - Beer- Liquior  (Beer 3-4, Liquor 4)   Drug use: No   Sexual activity: Never    Comment: lives with parents and brother,  Other Topics Concern   Not on file  Social History Narrative   Not on file   Social Determinants of Health   Financial Resource Strain: Not on file  Food Insecurity: Not on file  Transportation Needs: Not on file  Physical Activity: Not on file  Stress: Not on file  Social Connections: Not on file  Intimate Partner Violence: Not on file    Review of Systems:  All other review of systems negative except as mentioned in the HPI.  Physical Exam: Vital signs BP 139/76   Pulse 79   Temp 98.1 F (36.7 C)   Ht 6\' 1"  (1.854 m)   Wt 200 lb (90.7 kg)   SpO2 99%   BMI 26.39 kg/m   General:   Alert,  Well-developed, well-nourished, pleasant and cooperative in NAD Airway:  Mallampati 1 Lungs:  Clear throughout to auscultation.   Heart:  Regular rate and rhythm; no  murmurs, clicks, rubs,  or gallops. Abdomen:  Soft, nontender and nondistended. Normal bowel sounds.   Neuro/Psych:  Normal mood and affect. A and O x 3   Rickey [name] E. Candis Schatz, MD [facility]

## 2022-11-07 NOTE — Progress Notes (Signed)
Sedate, gd SR, tolerated procedure well, VSS, report to RN 

## 2022-11-07 NOTE — Progress Notes (Signed)
Pt's states no medical or surgical changes since previsit or office visit. 

## 2022-11-07 NOTE — Patient Instructions (Addendum)
Recommendation: Patient has a contact number available for                            emergencies. The signs and symptoms of potential                            delayed complications were discussed with the                            patient. Return to normal activities tomorrow.                            Written discharge instructions were provided to the                            patient.                           - Resume previous diet.                           - Continue present medications.                           - Continue to avoid NSAIDs; used acetaminophen                            (Tylenol) as needed for pain/headaches  YOU HAD AN ENDOSCOPIC PROCEDURE TODAY AT [facility]:   Refer to the procedure report that was given to you for any specific questions about what was found during the examination.  If the procedure report does not answer your questions, please call your gastroenterologist to clarify.  If you requested that your care partner not be given the details of your procedure findings, then the procedure report has been included in a sealed envelope for you to review at your convenience later.  YOU SHOULD EXPECT: Some feelings of bloating in the abdomen. Passage of more gas than usual.  Walking can help get rid of the air that was put into your GI tract during the procedure and reduce the bloating. If you had a lower endoscopy (such as a colonoscopy or flexible sigmoidoscopy) you may notice spotting of blood in your stool or on the toilet paper. If you underwent a bowel prep for your procedure, you may not have a normal bowel movement for a few days.  Please Note:  You might notice some irritation and congestion in your nose or some drainage.  This is from the oxygen used during your procedure.  There is no need for concern and it should clear up in a day or so.  SYMPTOMS TO REPORT IMMEDIATELY:  Following upper endoscopy (EGD)  Vomiting of blood or coffee  ground material  New chest pain or pain under the shoulder blades  Painful or persistently difficult swallowing  New shortness of breath  Fever of 100F or higher  Black, tarry-looking stools  For urgent or emergent issues, a gastroenterologist can be reached at any hour by calling [phone]. Do not use MyChart messaging for urgent concerns.   DIET:  We do recommend a small meal  at first, but then you may proceed to your regular diet.  Drink plenty of fluids but you should avoid alcoholic beverages for 24 hours.  ACTIVITY:  You should plan to take it easy for the rest of today and you should NOT DRIVE or use heavy machinery until tomorrow (because of the sedation medicines used during the test).    FOLLOW UP: Our staff will call the number listed on your records the next business day following your procedure.  We will call around 7:15- 8:00 am to check on you and address any questions or concerns that you may have regarding the information given to you following your procedure. If we do not reach you, we will leave a message.     If any biopsies were taken you will be contacted by phone or by letter within the next 1-3 weeks.  Please call us at [phone] if you have not heard about the biopsies in 3 weeks.   SIGNATURES/CONFIDENTIALITY: You and/or your care partner have signed paperwork which will be entered into your electronic medical record.  These signatures attest to the fact that that the information above on your After Visit Summary has been reviewed and is understood.  Full responsibility of the confidentiality of this discharge information lies with you and/or your care-partner.

## 2022-11-07 NOTE — Op Note (Signed)
[place] Patient Name: Rickey [name] Procedure Date: 11/07/2022 2:38 PM MRN: [id number] Endoscopist: Rickey [name] , MD, [id number] Age: 27 Referring MD:  Date of Birth: [birth year] Gender: Male Account #: [id number] Procedure:                Upper GI endoscopy Indications:              Follow-up of gastric ulcer Medicines:                Monitored Anesthesia Care Procedure:                Pre-Anesthesia Assessment:                           - Prior to the procedure, a History and Physical                            was performed, and patient medications and                            allergies were reviewed. The patient's tolerance of                            previous anesthesia was also reviewed. The risks                            and benefits of the procedure and the sedation                            options and risks were discussed with the patient.                            All questions were answered, and informed consent                            was obtained. Prior Anticoagulants: The patient has                            taken no anticoagulant or antiplatelet agents. ASA                            Grade Assessment: II - A patient with mild systemic                            disease. After reviewing the risks and benefits,                            the patient was deemed in satisfactory condition to                            undergo the procedure.                           After obtaining informed consent, the endoscope was  passed under direct vision. Throughout the                            procedure, the patient's blood pressure, pulse, and                            oxygen saturations were monitored continuously. The                            Olympus CF-HQ190L [id number]) Colonoscope was                            introduced through the mouth, and advanced to the                            second part of duodenum. The  upper GI endoscopy was                            accomplished without difficulty. The patient                            tolerated the procedure well. Scope In: Scope Out: Findings:                 The examined portions of the nasopharynx,                            oropharynx and larynx were normal.                           The examined esophagus was normal.                           The entire examined stomach was normal.                           The examined duodenum was normal. Complications:            No immediate complications. Estimated Blood Loss:     Estimated blood loss: none. Impression:               - The examined portions of the nasopharynx,                            oropharynx and larynx were normal.                           - Normal esophagus.                           - Normal stomach.                           - Normal examined duodenum.                           - No specimens collected. Previously noted ulcers  have healed Recommendation:           - Patient has a contact number available for                            emergencies. The signs and symptoms of potential                            delayed complications were discussed with the                            patient. Return to normal activities tomorrow.                            Written discharge instructions were provided to the                            patient.                           - Resume previous diet.                           - Continue present medications.                           - Continue to avoid NSAIDs; used acetaminophen                            (Tylenol) as needed for pain/headaches Rickey [name] E. Rickey Rand, MD 11/07/2022 2:58:35 PM This report has been signed electronically.

## 2022-11-10 ENCOUNTER — Telehealth: Payer: Self-pay

## 2022-11-10 NOTE — Telephone Encounter (Signed)
Follow up call placed, VM obtained and message left. 

## 2022-12-18 ENCOUNTER — Ambulatory Visit: Payer: BC Managed Care – PPO | Admitting: Family Medicine

## 2023-01-21 NOTE — Assessment & Plan Note (Signed)
Doing well at work without meds

## 2023-01-22 ENCOUNTER — Ambulatory Visit (INDEPENDENT_AMBULATORY_CARE_PROVIDER_SITE_OTHER): Payer: BC Managed Care – PPO | Admitting: Family Medicine

## 2023-01-22 VITALS — BP 120/74 | HR 101 | Temp 97.8°F | Resp 16 | Ht 74.0 in | Wt 203.4 lb

## 2023-01-22 DIAGNOSIS — K625 Hemorrhage of anus and rectum: Secondary | ICD-10-CM

## 2023-01-22 DIAGNOSIS — F988 Other specified behavioral and emotional disorders with onset usually occurring in childhood and adolescence: Secondary | ICD-10-CM

## 2023-01-22 NOTE — Progress Notes (Signed)
Subjective:   By signing my name below, I, Rickey [name], attest that this documentation has been prepared under the direction and in the presence of Mosie Lukes, MD., 01/22/2023.   Patient ID: Rickey [name], male    DOB: 1996-08-11, 27 y.o.   MRN: [id number]  Chief Complaint  Patient presents with   Follow-up    Follow up   HPI Patient is in today for an office visit.   ADD (attention deficit disorder) Patient reports that he is managing his ADD well and does not require medications at this time.  Elevated Blood Pressure Patient's blood pressure and pulse are slightly elevated today upon arrival, but normal when rechecked during the visit. He denies recent lifestyle changes and states that the elevated readings were likely due to anxiousness. Additionally, he denies CP/palpitations/SOB/HA/fever/chills/GI or GU symptoms.  BP Readings from Last 3 Encounters:  01/22/23 120/74  11/07/22 113/66  09/10/22 114/70   Pulse Readings from Last 3 Encounters:  01/22/23 (!) 101  11/07/22 63  09/10/22 (!) 59   Heartburn Patient reports that he takes Omeprazole 20 mg as needed for heartburn. He takes  Past Medical History:  Diagnosis Date   Acne    ADD (attention deficit disorder)    Autism spectrum disorder 06/24/2015   Autistic disorder    Family history of adverse reaction to anesthesia    Father and brother woke up during surgery   GERD (gastroesophageal reflux disease)    Preventative health care 06/24/2015    Past Surgical History:  Procedure Laterality Date   ADENOIDECTOMY AND MYRINGOTOMY WITH TUBE PLACEMENT     CHOLECYSTECTOMY N/A 11/20/2020   Procedure: LAPAROSCOPIC CHOLECYSTECTOMY;  Surgeon: Erroll Luna, MD;  Location: [facility];  Service: General;  Laterality: N/A;  LAPAROSCOPIC CHOLECYSTECTOMY   WISDOM TOOTH EXTRACTION      Family History  Problem Relation Age of Onset   Diabetes Father    Kidney disease Brother        kidney stones   Diabetes Paternal  Grandmother    Heart disease Paternal Grandmother    Diabetes Paternal Grandfather    Heart disease Paternal Grandfather    Esophageal cancer Neg Hx    Liver disease Neg Hx    Colon cancer Neg Hx     Social History   Socioeconomic History   Marital status: Single    Spouse name: Not on file   Number of children: 0   Years of education: Not on file   Highest education level: Not on file  Occupational History   Occupation: Ship broker at [employer]   Occupation: mold maker  Tobacco Use   Smoking status: Never   Smokeless tobacco: Never  Vaping Use   Vaping Use: Never used  Substance and Sexual Activity   Alcohol use: Yes    Alcohol/week: 0.0 standard drinks of alcohol    Comment: sporatic - Beer- Liquior  (Beer 3-4, Liquor 4)   Drug use: No   Sexual activity: Never    Comment: lives with parents and brother,  Other Topics Concern   Not on file  Social History Narrative   Not on file   Social Determinants of Health   Financial Resource Strain: Patient Declined (01/22/2023)   Overall Financial Resource Strain (CARDIA)    Difficulty of Paying Living Expenses: Patient declined  Food Insecurity: Patient Declined (01/22/2023)   Hunger Vital Sign    Worried About Running Out of Food in the Last Year: Patient declined  Ran Out of Food in the Last Year: Patient declined  Transportation Needs: Patient Declined (01/22/2023)   PRAPARE - Hydrologist (Medical): Patient declined    Lack of Transportation (Non-Medical): Patient declined  Physical Activity: Unknown (01/22/2023)   Exercise Vital Sign    Days of Exercise per Week: Patient declined    Minutes of Exercise per Session: Not on file  Stress: No Stress Concern Present (01/22/2023)   [place]    Feeling of Stress : Not at all  Social Connections: Unknown (01/22/2023)   Social Connection and Isolation Panel [NHANES]     Frequency of Communication with Friends and Family: Patient declined    Frequency of Social Gatherings with Friends and Family: Patient declined    Attends Religious Services: Patient declined    Marine scientist or Organizations: Patient declined    Attends Music therapist: Not on file    Marital Status: Patient declined  Intimate Partner Violence: Not on file    Outpatient Medications Prior to Visit  Medication Sig Dispense Refill   hydrocortisone (ANUSOL-HC) 2.5 % rectal cream Place 1 Application rectally 2 (two) times daily as needed for hemorrhoids or anal itching. 30 g 1   omeprazole (PRILOSEC) 20 MG capsule Take 1 capsule (20 mg total) by mouth 2 (two) times daily before a meal. 112 capsule 0   No facility-administered medications prior to visit.    No Known Allergies  Review of Systems  Constitutional:  Negative for chills and fever.  Respiratory:  Negative for shortness of breath.   Cardiovascular:  Negative for chest pain and palpitations.  Gastrointestinal:  Negative for abdominal pain, blood in stool, constipation, diarrhea, nausea and vomiting.  Genitourinary:  Negative for dysuria, frequency, hematuria and urgency.  Skin:           Neurological:  Negative for headaches.       Objective:    Physical Exam Constitutional:      General: He is not in acute distress.    Appearance: Normal appearance. He is normal weight. He is not ill-appearing.  HENT:     Head: Normocephalic and atraumatic.     Right Ear: External ear normal.     Left Ear: External ear normal.     Nose: Nose normal.     Mouth/Throat:     Mouth: Mucous membranes are moist.     Pharynx: Oropharynx is clear.  Eyes:     General:        Right eye: No discharge.        Left eye: No discharge.     Extraocular Movements: Extraocular movements intact.     Conjunctiva/sclera: Conjunctivae normal.     Pupils: Pupils are equal, round, and reactive to light.  Cardiovascular:      Rate and Rhythm: Normal rate and regular rhythm.     Pulses: Normal pulses.     Heart sounds: Normal heart sounds. No murmur heard.    No gallop.  Pulmonary:     Effort: Pulmonary effort is normal. No respiratory distress.     Breath sounds: Normal breath sounds. No wheezing or rales.  Abdominal:     General: Bowel sounds are normal.     Palpations: Abdomen is soft.     Tenderness: There is no abdominal tenderness. There is no guarding.  Musculoskeletal:        General: Normal range of motion.  Cervical back: Normal range of motion.     Right lower leg: No edema.     Left lower leg: No edema.  Skin:    General: Skin is warm and dry.  Neurological:     Mental Status: He is alert and oriented to person, place, and time.  Psychiatric:        Mood and Affect: Mood normal.        Behavior: Behavior normal.        Judgment: Judgment normal.     BP 120/74 (BP Location: Left Arm, Patient Position: Sitting, Cuff Size: Normal)   Pulse (!) 101   Temp 97.8 F (36.6 C) (Oral)   Resp 16   Ht 6\' 2"  (1.88 m)   Wt 203 lb 6.4 oz (92.3 kg)   SpO2 97%   BMI 26.12 kg/m  Wt Readings from Last 3 Encounters:  01/22/23 203 lb 6.4 oz (92.3 kg)  11/07/22 200 lb (90.7 kg)  09/10/22 200 lb (90.7 kg)    Diabetic Foot Exam - Simple   No data filed    Lab Results  Component Value Date   WBC 9.2 06/10/2021   HGB 16.8 06/10/2021   HCT 48.5 06/10/2021   PLT 232.0 06/10/2021   GLUCOSE 80 06/10/2021   CHOL 141 06/10/2021   TRIG 128.0 06/10/2021   HDL 49.90 06/10/2021   LDLCALC 65 06/10/2021   ALT 29 06/10/2021   AST 20 06/10/2021   NA 140 06/10/2021   K 3.9 06/10/2021   CL 102 06/10/2021   CREATININE 0.98 06/10/2021   BUN 11 06/10/2021   CO2 28 06/10/2021   TSH 1.52 06/10/2021   HGBA1C 5.0 06/10/2021    Lab Results  Component Value Date   TSH 1.52 06/10/2021   Lab Results  Component Value Date   WBC 9.2 06/10/2021   HGB 16.8 06/10/2021   HCT 48.5 06/10/2021   MCV 86.7  06/10/2021   PLT 232.0 06/10/2021   Lab Results  Component Value Date   NA 140 06/10/2021   K 3.9 06/10/2021   CO2 28 06/10/2021   GLUCOSE 80 06/10/2021   BUN 11 06/10/2021   CREATININE 0.98 06/10/2021   BILITOT 0.8 06/10/2021   ALKPHOS 88 06/10/2021   AST 20 06/10/2021   ALT 29 06/10/2021   PROT 7.8 06/10/2021   ALBUMIN 5.0 06/10/2021   CALCIUM 10.5 06/10/2021   ANIONGAP 13 11/20/2020   GFR 108.09 06/10/2021   Lab Results  Component Value Date   CHOL 141 06/10/2021   Lab Results  Component Value Date   HDL 49.90 06/10/2021   Lab Results  Component Value Date   LDLCALC 65 06/10/2021   Lab Results  Component Value Date   TRIG 128.0 06/10/2021   Lab Results  Component Value Date   CHOLHDL 3 06/10/2021   Lab Results  Component Value Date   HGBA1C 5.0 06/10/2021      Assessment & Plan:  ADD (attention deficit disorder): This is well-controlled without any medications.  Elevated Blood Pressure: Encouraged patient to check blood pressure at home when relaxed.  Problem List Items Addressed This Visit     ADD (attention deficit disorder) - Primary    Doing well at work without meds      Rectal bleeding    No complaints of recent episodes. No changes      No orders of the defined types were placed in this encounter.  IPenni Homans, MD, personally preformed the services described  in this documentation.  All medical record entries made by the scribe were at my direction and in my presence.  I have reviewed the chart and discharge instructions (if applicable) and agree that the record reflects my personal performance and is accurate and complete. 01/22/2023  I,Mohammed Iqbal,acting as a scribe for Penni Homans, MD.,have documented all relevant documentation on the behalf of Penni Homans, MD,as directed by  Penni Homans, MD while in the presence of Penni Homans, MD.  Penni Homans, MD

## 2023-01-22 NOTE — Patient Instructions (Signed)
Omron blood pressure cuff, automated upper arm cuff  Check blood pressure once monthly,resting 10 minutes and then record

## 2023-01-25 ENCOUNTER — Encounter: Payer: Self-pay | Admitting: Family Medicine

## 2023-01-25 NOTE — Assessment & Plan Note (Signed)
No complaints of recent episodes. No changes

## 2023-07-27 NOTE — Assessment & Plan Note (Signed)
Doing well without meds

## 2023-07-27 NOTE — Assessment & Plan Note (Signed)
Patient encouraged to maintain heart healthy diet, regular exercise, adequate sleep. Consider daily probiotics. Take medications as prescribed. Labs reviewed. Given and reviewed copy of ACP documents from [place] Secretary of State and encouraged to complete and return 

## 2023-07-28 ENCOUNTER — Ambulatory Visit (INDEPENDENT_AMBULATORY_CARE_PROVIDER_SITE_OTHER): Payer: BC Managed Care – PPO | Admitting: Family Medicine

## 2023-07-28 ENCOUNTER — Encounter: Payer: Self-pay | Admitting: Family Medicine

## 2023-07-28 VITALS — BP 130/68 | HR 78 | Temp 98.0°F | Resp 16 | Ht 74.0 in | Wt 204.8 lb

## 2023-07-28 DIAGNOSIS — Z0000 Encounter for general adult medical examination without abnormal findings: Secondary | ICD-10-CM

## 2023-07-28 DIAGNOSIS — F988 Other specified behavioral and emotional disorders with onset usually occurring in childhood and adolescence: Secondary | ICD-10-CM | POA: Diagnosis not present

## 2023-07-28 NOTE — Patient Instructions (Signed)
Preventive Care 21-26 Years Old, Male Preventive care refers to lifestyle choices and visits with your health care provider that can promote health and wellness. Preventive care visits are also called wellness exams. What can I expect for my preventive care visit? Counseling During your preventive care visit, your health care provider may ask about your: Medical history, including: Past medical problems. Family medical history. Current health, including: Emotional well-being. Home life and relationship well-being. Sexual activity. Lifestyle, including: Alcohol, nicotine or tobacco, and drug use. Access to firearms. Diet, exercise, and sleep habits. Safety issues such as seatbelt and bike helmet use. Sunscreen use. Work and work environment. Physical exam Your health care provider may check your: Height and weight. These may be used to calculate your BMI (body mass index). BMI is a measurement that tells if you are at a healthy weight. Waist circumference. This measures the distance around your waistline. This measurement also tells if you are at a healthy weight and may help predict your risk of certain diseases, such as type 2 diabetes and high blood pressure. Heart rate and blood pressure. Body temperature. Skin for abnormal spots. What immunizations do I need?  Vaccines are usually given at various ages, according to a schedule. Your health care provider will recommend vaccines for you based on your age, medical history, and lifestyle or other factors, such as travel or where you work. What tests do I need? Screening Your health care provider may recommend screening tests for certain conditions. This may include: Lipid and cholesterol levels. Diabetes screening. This is done by checking your blood sugar (glucose) after you have not eaten for a while (fasting). Hepatitis B test. Hepatitis C test. HIV (human immunodeficiency virus) test. STI (sexually transmitted infection)  testing, if you are at risk. Talk with your health care provider about your test results, treatment options, and if necessary, the need for more tests. Follow these instructions at home: Eating and drinking  Eat a healthy diet that includes fresh fruits and vegetables, whole grains, lean protein, and low-fat dairy products. Drink enough fluid to keep your urine pale yellow. Take vitamin and mineral supplements as recommended by your health care provider. Do not drink alcohol if your health care provider tells you not to drink. If you drink alcohol: Limit how much you have to 0-2 drinks a day. Know how much alcohol is in your drink. In the [place] one drink equals one 12 oz bottle of beer (355 mL), one 5 oz glass of wine (148 mL), or one 1 oz glass of hard liquor (44 mL). Lifestyle Brush your teeth every morning and night with fluoride toothpaste. Floss one time each day. Exercise for at least 30 minutes 5 or more days each week. Do not use any products that contain nicotine or tobacco. These products include cigarettes, chewing tobacco, and vaping devices, such as e-cigarettes. If you need help quitting, ask your health care provider. Do not use drugs. If you are sexually active, practice safe sex. Use a condom or other form of protection to prevent STIs. Find healthy ways to manage stress, such as: Meditation, yoga, or listening to music. Journaling. Talking to a trusted person. Spending time with friends and family. Minimize exposure to UV radiation to reduce your risk of skin cancer. Safety Always wear your seat belt while driving or riding in a vehicle. Do not drive: If you have been drinking alcohol. Do not ride with someone who has been drinking. If you have been using any mind-altering substances   or drugs. While texting. When you are tired or distracted. Wear a helmet and other protective equipment during sports activities. If you have firearms in your house, make sure you  follow all gun safety procedures. Seek help if you have been physically or sexually abused. What's next? Go to your health care provider once a year for an annual wellness visit. Ask your health care provider how often you should have your eyes and teeth checked. Stay up to date on all vaccines. This information is not intended to replace advice given to you by your health care provider. Make sure you discuss any questions you have with your health care provider. Document Revised: 04/10/2021 Document Reviewed: 04/10/2021 Elsevier Patient Education  2024 [employer]  

## 2023-07-28 NOTE — Progress Notes (Signed)
Subjective:    Patient ID: Rickey [name], male    DOB: 02/17/1996, 27 y.o.   MRN: [id number]  Chief Complaint  Patient presents with  . Annual Exam    Annual Exam    HPI Discussed the use of AI scribe software for clinical note transcription with the patient, who gave verbal consent to proceed.  History of Present Illness   The patient, a Research scientist (physical sciences), presents for a routine check-up. He reports no recent illnesses, ER visits, or significant symptoms such as headaches, skin trouble, or chest pain. He works night shifts and reports getting reasonable amounts of sleep, although he acknowledges that his work schedule is demanding. He reports a change in bowel movements since having his gallbladder removed, but does not express significant concern about this. The patient's diet is not specified, but he acknowledges room for improvement. He does not smoke and wears seat belts consistently. He has not had a dental check-up in a while.        Past Medical History:  Diagnosis Date  . Acne   . ADD (attention deficit disorder)   . Autism spectrum disorder 06/24/2015  . Autistic disorder   . Family history of adverse reaction to anesthesia    Father and brother woke up during surgery  . GERD (gastroesophageal reflux disease)   . Preventative health care 06/24/2015    Past Surgical History:  Procedure Laterality Date  . ADENOIDECTOMY AND MYRINGOTOMY WITH TUBE PLACEMENT    . CHOLECYSTECTOMY N/A 11/20/2020   Procedure: LAPAROSCOPIC CHOLECYSTECTOMY;  Surgeon: Harriette Bouillon, MD;  Location: [facility] OR;  Service: General;  Laterality: N/A;  LAPAROSCOPIC CHOLECYSTECTOMY  . WISDOM TOOTH EXTRACTION      Family History  Problem Relation Age of Onset  . Diabetes Father   . Kidney disease Brother        kidney stones  . Diabetes Paternal Grandmother   . Heart disease Paternal Grandmother   . Diabetes Paternal Grandfather   . Heart disease Paternal Grandfather   . Esophageal cancer Neg Hx   .  Liver disease Neg Hx   . Colon cancer Neg Hx     Social History   Socioeconomic History  . Marital status: Single    Spouse name: Not on file  . Number of children: 0  . Years of education: Not on file  . Highest education level: Not on file  Occupational History  . Occupation: Consulting civil engineer at [employer]  . Occupation: mold maker  Tobacco Use  . Smoking status: Never  . Smokeless tobacco: Never  Vaping Use  . Vaping status: Never Used  Substance and Sexual Activity  . Alcohol use: Yes    Alcohol/week: 0.0 standard drinks of alcohol    Comment: sporatic - Beer- Liquior  (Beer 3-4, Liquor 4)  . Drug use: No  . Sexual activity: Never    Comment: lives with parents and brother,  Other Topics Concern  . Not on file  Social History Narrative  . Not on file   Social Determinants of Health   Financial Resource Strain: Patient Declined (01/22/2023)   Overall Financial Resource Strain (CARDIA)   . Difficulty of Paying Living Expenses: Patient declined  Food Insecurity: Patient Declined (01/22/2023)   Hunger Vital Sign   . Worried About Programme researcher, broadcasting/film/video in the Last Year: Patient declined   . Ran Out of Food in the Last Year: Patient declined  Transportation Needs: Patient Declined (01/22/2023)   PRAPARE - Transportation   .  Lack of Transportation (Medical): Patient declined   . Lack of Transportation (Non-Medical): Patient declined  Physical Activity: Unknown (01/22/2023)   Exercise Vital Sign   . Days of Exercise per Week: Patient declined   . Minutes of Exercise per Session: Not on file  Stress: No Stress Concern Present (01/22/2023)   [employer] of Occupational Health - Occupational Stress Questionnaire   . Feeling of Stress : Not at all  Social Connections: Unknown (01/22/2023)   Social Connection and Isolation Panel [NHANES]   . Frequency of Communication with Friends and Family: Patient declined   . Frequency of Social Gatherings with Friends and Family:  Patient declined   . Attends Religious Services: Patient declined   . Active Member of Clubs or Organizations: Patient declined   . Attends Banker Meetings: Not on file   . Marital Status: Patient declined  Intimate Partner Violence: Not on file    Outpatient Medications Prior to Visit  Medication Sig Dispense Refill  . hydrocortisone (ANUSOL-HC) 2.5 % rectal cream Place 1 Application rectally 2 (two) times daily as needed for hemorrhoids or anal itching. 30 g 1  . omeprazole (PRILOSEC) 20 MG capsule Take 1 capsule (20 mg total) by mouth 2 (two) times daily before a meal. 112 capsule 0   No facility-administered medications prior to visit.    No Known Allergies  Review of Systems  Constitutional:  Negative for chills, fever and malaise/fatigue.  HENT:  Negative for congestion and hearing loss.   Eyes:  Negative for discharge.  Respiratory:  Negative for cough, sputum production and shortness of breath.   Cardiovascular:  Negative for chest pain, palpitations and leg swelling.  Gastrointestinal:  Negative for abdominal pain, blood in stool, constipation, diarrhea, heartburn, nausea and vomiting.  Genitourinary:  Negative for dysuria, frequency, hematuria and urgency.  Musculoskeletal:  Negative for back pain, falls and myalgias.  Skin:  Negative for rash.  Neurological:  Negative for dizziness, sensory change, loss of consciousness, weakness and headaches.  Endo/Heme/Allergies:  Negative for environmental allergies. Does not bruise/bleed easily.  Psychiatric/Behavioral:  Negative for depression and suicidal ideas. The patient is not nervous/anxious and does not have insomnia.       Objective:    Physical Exam Vitals reviewed.  Constitutional:      General: He is not in acute distress.    Appearance: Normal appearance. He is not ill-appearing or diaphoretic.  HENT:     Head: Normocephalic and atraumatic.     Right Ear: Tympanic membrane, ear canal and external  ear normal. There is no impacted cerumen.     Left Ear: Tympanic membrane, ear canal and external ear normal. There is no impacted cerumen.     Nose: Nose normal. No rhinorrhea.     Mouth/Throat:     Pharynx: Oropharynx is clear.  Eyes:     General: No scleral icterus.    Extraocular Movements: Extraocular movements intact.     Conjunctiva/sclera: Conjunctivae normal.     Pupils: Pupils are equal, round, and reactive to light.  Neck:     Thyroid: No thyroid mass or thyroid tenderness.  Cardiovascular:     Rate and Rhythm: Normal rate and regular rhythm.     Pulses: Normal pulses.     Heart sounds: Normal heart sounds. No murmur heard. Pulmonary:     Effort: Pulmonary effort is normal.     Breath sounds: Normal breath sounds. No wheezing.  Abdominal:     General: Bowel sounds are  normal.     Palpations: Abdomen is soft. There is no mass.     Tenderness: There is no guarding.  Musculoskeletal:        General: No swelling. Normal range of motion.     Cervical back: Normal range of motion and neck supple. No rigidity.     Right lower leg: No edema.     Left lower leg: No edema.  Lymphadenopathy:     Cervical: No cervical adenopathy.  Skin:    General: Skin is warm and dry.     Findings: No rash.  Neurological:     General: No focal deficit present.     Mental Status: He is alert and oriented to person, place, and time.     Cranial Nerves: No cranial nerve deficit.     Deep Tendon Reflexes: Reflexes normal.  Psychiatric:        Mood and Affect: Mood normal.        Behavior: Behavior normal.   BP 130/68 (BP Location: Left Arm, Patient Position: Sitting, Cuff Size: Normal)   Pulse 78   Temp 98 F (36.7 C) (Oral)   Resp 16   Ht 6\' 2"  (1.88 m)   Wt 204 lb 12.8 oz (92.9 kg)   SpO2 98%   BMI 26.29 kg/m  Wt Readings from Last 3 Encounters:  07/28/23 204 lb 12.8 oz (92.9 kg)  01/22/23 203 lb 6.4 oz (92.3 kg)  11/07/22 200 lb (90.7 kg)    Diabetic Foot Exam - Simple   No  data filed    Lab Results  Component Value Date   WBC 9.2 06/10/2021   HGB 16.8 06/10/2021   HCT 48.5 06/10/2021   PLT 232.0 06/10/2021   GLUCOSE 80 06/10/2021   CHOL 141 06/10/2021   TRIG 128.0 06/10/2021   HDL 49.90 06/10/2021   LDLCALC 65 06/10/2021   ALT 29 06/10/2021   AST 20 06/10/2021   NA 140 06/10/2021   K 3.9 06/10/2021   CL 102 06/10/2021   CREATININE 0.98 06/10/2021   BUN 11 06/10/2021   CO2 28 06/10/2021   TSH 1.52 06/10/2021   HGBA1C 5.0 06/10/2021    Lab Results  Component Value Date   TSH 1.52 06/10/2021   Lab Results  Component Value Date   WBC 9.2 06/10/2021   HGB 16.8 06/10/2021   HCT 48.5 06/10/2021   MCV 86.7 06/10/2021   PLT 232.0 06/10/2021   Lab Results  Component Value Date   NA 140 06/10/2021   K 3.9 06/10/2021   CO2 28 06/10/2021   GLUCOSE 80 06/10/2021   BUN 11 06/10/2021   CREATININE 0.98 06/10/2021   BILITOT 0.8 06/10/2021   ALKPHOS 88 06/10/2021   AST 20 06/10/2021   ALT 29 06/10/2021   PROT 7.8 06/10/2021   ALBUMIN 5.0 06/10/2021   CALCIUM 10.5 06/10/2021   ANIONGAP 13 11/20/2020   GFR 108.09 06/10/2021   Lab Results  Component Value Date   CHOL 141 06/10/2021   Lab Results  Component Value Date   HDL 49.90 06/10/2021   Lab Results  Component Value Date   LDLCALC 65 06/10/2021   Lab Results  Component Value Date   TRIG 128.0 06/10/2021   Lab Results  Component Value Date   CHOLHDL 3 06/10/2021   Lab Results  Component Value Date   HGBA1C 5.0 06/10/2021       Assessment & Plan:  Preventative health care Assessment & Plan: Patient encouraged to maintain heart  healthy diet, regular exercise, adequate sleep. Consider daily probiotics. Take medications as prescribed. Labs reviewed. Given and reviewed copy of ACP documents from [facility] Secretary of State and encouraged to complete and return    Attention deficit disorder, unspecified hyperactivity presence Assessment & Plan: Doing well without meds.       Assessment and Plan    Post-Cholecystectomy Syndrome Reports changes in bowel movements since gallbladder removal. No other symptoms reported. -Consider adding a daily fiber supplement such as psyllium or Benefiber to diet. -Monitor symptoms and report any significant changes.  General Health Maintenance No recent illnesses or ER visits. No symptoms of concern reported. Last labs were normal two years ago. -No need for repeat labs at this time given previous normal results and lack of symptoms. -Encouraged to maintain healthy lifestyle habits including hydration, balanced diet, and regular sleep. -Advised to consider annual flu shot, but patient declined at this time. -Encouraged to visit dentist at least once a year for oral health and its connection to heart health. -Plan for annual check-up unless symptoms arise.         Danise Edge, MD

## 2024-03-12 ENCOUNTER — Telehealth: Admitting: Nurse Practitioner

## 2024-03-12 DIAGNOSIS — J029 Acute pharyngitis, unspecified: Secondary | ICD-10-CM

## 2024-03-12 NOTE — Progress Notes (Signed)
 I have spent 5 minutes in review of e-visit questionnaire, review and updating patient chart, medical decision making and response to patient.   Claiborne Rigg, NP

## 2024-03-12 NOTE — Progress Notes (Signed)
 E-Visit for Sore Throat  We are sorry that you are not feeling well.  Here is how we plan to help!  Providers prescribe antibiotics to treat infections caused by bacteria. Antibiotics are very powerful in treating bacterial infections when they are used properly. To maintain their effectiveness, they should be used only when necessary. Overuse of antibiotics has resulted in the development of superbugs that are resistant to treatment!    After careful review of your answers, I would not recommend an antibiotic for your condition.  Antibiotics are not effective against viruses and therefore should not be used to treat them. Common examples of infections caused by viruses include colds and flu  I do recommend you follow back up with us  in 5-7 days if you are not feeling better or if you develop a high fever.    Your symptoms indicate a likely viral infection (Pharyngitis).   Pharyngitis is inflammation in the back of the throat which can cause a sore throat, scratchiness and sometimes difficulty swallowing.   Pharyngitis is typically caused by a respiratory virus and will just run its course.  Please keep in mind that your symptoms could last up to 10 days.  For throat pain, we recommend over the counter oral pain relief medications such as acetaminophen  or aspirin, or anti-inflammatory medications such as ibuprofen or naproxen sodium.  Topical treatments such as oral throat lozenges or sprays may be used as needed.  Avoid close contact with loved ones, especially the very young and elderly.  Remember to wash your hands thoroughly throughout the day as this is the number one way to prevent the spread of infection and wipe down door knobs and counters with disinfectant.  After careful review of your answers, I would not recommend an antibiotic for your condition.  Antibiotics should not be used to treat conditions that we suspect are caused by viruses like the virus that causes the common cold or flu.  However, some people can have Strep with atypical symptoms. You may need formal testing in clinic or office to confirm if your symptoms continue or worsen.  Providers prescribe antibiotics to treat infections caused by bacteria. Antibiotics are very powerful in treating bacterial infections when they are used properly.  To maintain their effectiveness, they should be used only when necessary.  Overuse of antibiotics has resulted in the development of super bugs that are resistant to treatment!    Home Care: Only take medications as instructed by your medical team. Do not drink alcohol while taking these medications. A steam or ultrasonic humidifier can help congestion.  You can place a towel over your head and breathe in the steam from hot water coming from a faucet. Avoid close contacts especially the very young and the elderly. Cover your mouth when you cough or sneeze. Always remember to wash your hands.  Get Help Right Away If: You develop worsening fever or throat pain. You develop a severe head ache or visual changes. Your symptoms persist after you have completed your treatment plan.  Make sure you Understand these instructions. Will watch your condition. Will get help right away if you are not doing well or get worse.   Thank you for choosing an e-visit.  Your e-visit answers were reviewed by a board certified advanced clinical practitioner to complete your personal care plan. Depending upon the condition, your plan could have included both over the counter or prescription medications.  Please review your pharmacy choice. Make sure the pharmacy is open  so you can pick up prescription now. If there is a problem, you may contact your provider through [employer] and have the prescription routed to another pharmacy.  Your safety is important to us . If you have drug allergies check your prescription carefully.   For the next 24 hours you can use MyChart to ask questions about  today's visit, request a non-urgent call back, or ask for a work or school excuse. You will get an email in the next two days asking about your experience. I hope that your e-visit has been valuable and will speed your recovery.

## 2024-04-04 ENCOUNTER — Ambulatory Visit
Admission: RE | Admit: 2024-04-04 | Discharge: 2024-04-04 | Disposition: A | Discharge status: Home / Self Care | Source: Ambulatory Visit | Attending: Family Medicine | Admitting: Family Medicine

## 2024-04-04 VITALS — BP 154/96 | HR 70 | Temp 98.8°F | Resp 16

## 2024-04-04 DIAGNOSIS — H6691 Otitis media, unspecified, right ear: Secondary | ICD-10-CM

## 2024-04-04 MED ORDER — AMOXICILLIN-POT CLAVULANATE 875-125 MG PO TABS: 1.0000 | ORAL_TABLET | Freq: Two times a day (BID) | ORAL | 0 refills | Status: AC

## 2024-04-04 NOTE — ED Triage Notes (Signed)
 [facility] with [facility] of right ear fullness for a few weeks. Pt reports he was sick with a uri last week and since then he has had this feeling.

## 2024-04-04 NOTE — ED Provider Notes (Signed)
 [name]    CSN: [id number] Arrival date & time: 04/04/24  4132      History   Chief Complaint Chief Complaint  Patient presents with   Ear Fullness    HPI [name] is a 28 y.o. male.   HPI Pleasant 28 year old male presents with right ear pain/blockage with upper respiratory-like symptoms for 3 weeks.  PMH significant for autism spectrum disorder, ADD, and GERD.  Past Medical History:  Diagnosis Date   Acne    ADD (attention deficit disorder)    Autism spectrum disorder 06/24/2015   Autistic disorder    Family history of adverse reaction to anesthesia    Father and brother woke up during surgery   GERD (gastroesophageal reflux disease)    Preventative health care 06/24/2015    Patient Active Problem List   Diagnosis Date Noted   Change in bowel habits 09/04/2022   Abdominal pain, epigastric 09/04/2022   Rectal bleeding 06/16/2022   Autism spectrum disorder 06/24/2015   Acne 06/24/2015   Preventative health care 06/24/2015   ADD (attention deficit disorder)     Past Surgical History:  Procedure Laterality Date   ADENOIDECTOMY AND MYRINGOTOMY WITH TUBE PLACEMENT     CHOLECYSTECTOMY N/A 11/20/2020   Procedure: LAPAROSCOPIC CHOLECYSTECTOMY;  Surgeon: Sim Dryer, MD;  Location: [facility] OR;  Service: General;  Laterality: N/A;  LAPAROSCOPIC CHOLECYSTECTOMY   WISDOM TOOTH EXTRACTION         Home Medications    Prior to Admission medications   Medication Sig Start Date End Date Taking? Authorizing Provider  amoxicillin-clavulanate (AUGMENTIN) 875-125 MG tablet Take 1 tablet by mouth 2 (two) times daily for 10 days. 04/04/24 04/14/24 Yes Leonides Ramp, FNP  hydrocortisone  (ANUSOL -HC) 2.5 % rectal cream Place 1 Application rectally 2 (two) times daily as needed for hemorrhoids or anal itching. 09/03/22   Zehr, Jessica D, PA-C  omeprazole  (PRILOSEC) 20 MG capsule Take 1 capsule (20 mg total) by mouth 2 (two) times daily before a meal. 09/10/22    Elois Hair, MD    Family History Family History  Problem Relation Age of Onset   Diabetes Father    Kidney disease Brother        kidney stones   Diabetes Paternal Grandmother    Heart disease Paternal Grandmother    Diabetes Paternal Grandfather    Heart disease Paternal Grandfather    Esophageal cancer Neg Hx    Liver disease Neg Hx    Colon cancer Neg Hx     Social History Social History   Tobacco Use   Smoking status: Never   Smokeless tobacco: Never  Vaping Use   Vaping status: Never Used  Substance Use Topics   Alcohol use: Yes    Alcohol/week: 0.0 standard drinks of alcohol    Comment: sporatic - Beer- Liquior  (Beer 3-4, Liquor 4)   Drug use: No     Allergies   Patient has no known allergies.   Review of Systems Review of Systems  HENT:  Positive for ear pain.   All other systems reviewed and are negative.    Physical Exam Triage Vital Signs ED Triage Vitals  Encounter Vitals Group     BP      Systolic BP Percentile      Diastolic BP Percentile      Pulse      Resp      Temp      Temp src      SpO2  Weight      Height      Head Circumference      Peak Flow      Pain Score      Pain Loc      Pain Education      Exclude from Growth Chart    No data found.  Updated Vital Signs BP (!) 154/96   Pulse 70   Temp 98.8 F (37.1 C)   Resp 16   SpO2 98%    Physical Exam Vitals and nursing note reviewed.  Constitutional:      Appearance: Normal appearance. He is normal weight.  HENT:     Head: Normocephalic and atraumatic.     Right Ear: External ear normal.     Left Ear: External ear normal.     Ears:     Comments: Moderate eustachian tube dysfunction noted bilaterally; right TM: Erythematous, bulging    Mouth/Throat:     Mouth: Mucous membranes are moist.     Pharynx: Oropharynx is clear.  Eyes:     Extraocular Movements: Extraocular movements intact.     Conjunctiva/sclera: Conjunctivae normal.     Pupils: Pupils  are equal, round, and reactive to light.  Cardiovascular:     Rate and Rhythm: Normal rate and regular rhythm.     Pulses: Normal pulses.     Heart sounds: Normal heart sounds.  Pulmonary:     Effort: Pulmonary effort is normal.     Breath sounds: Normal breath sounds. No wheezing, rhonchi or rales.  Musculoskeletal:        General: Normal range of motion.  Skin:    General: Skin is warm and dry.  Neurological:     General: No focal deficit present.     Mental Status: He is alert and oriented to person, place, and time. Mental status is at baseline.  Psychiatric:        Mood and Affect: Mood normal.        Behavior: Behavior normal.      UC Treatments / Results  Labs (all labs ordered are listed, but only abnormal results are displayed) Labs Reviewed - No data to display  EKG   Radiology No results found.  Procedures Procedures (including critical care time)  Medications Ordered in UC Medications - No data to display  Initial Impression / Assessment and Plan / UC Course  I have reviewed the triage vital signs and the nursing notes.  Pertinent labs & imaging results that were available during my care of the patient were reviewed by me and considered in my medical decision making (see chart for details).     MDM: 1.  Acute right otitis media-Rx'd Augmentin 875/125 mg tablet: Take 1 tablet twice daily x 10 days. Advised patient to take medication as directed with food to completion.  Encouraged to increase daily water intake to 64 ounces per day while taking this medication.  Advised if symptoms worsen and/or unresolved please follow-up with your PCP, ENT, or here for further evaluation. Final Clinical Impressions(s) / UC Diagnoses   Final diagnoses:  Acute right otitis media     Discharge Instructions      Advised patient to take medication as directed with food to completion.  Encouraged to increase daily water intake to 64 ounces per day while taking this  medication.  Advised if symptoms worsen and/or unresolved please follow-up with your PCP, ENT, or here for further evaluation.   ED Prescriptions     Medication  Sig Dispense Auth. Provider   amoxicillin-clavulanate (AUGMENTIN) 875-125 MG tablet Take 1 tablet by mouth 2 (two) times daily for 10 days. 20 tablet Danniell Rotundo, FNP      PDMP not reviewed this encounter.   Leonides Ramp, FNP 04/04/24 [phone]

## 2024-04-04 NOTE — Discharge Instructions (Addendum)
 Advised patient to take medication as directed with food to completion.  Encouraged to increase daily water intake to 64 ounces per day while taking this medication.  Advised if symptoms worsen and/or unresolved please follow-up with your PCP, ENT, or here for further evaluation.

## 2024-06-22 ENCOUNTER — Telehealth: Admitting: Physician Assistant

## 2024-06-22 DIAGNOSIS — J069 Acute upper respiratory infection, unspecified: Secondary | ICD-10-CM | POA: Diagnosis not present

## 2024-06-22 DIAGNOSIS — B9689 Other specified bacterial agents as the cause of diseases classified elsewhere: Secondary | ICD-10-CM

## 2024-06-22 MED ORDER — AMOXICILLIN-POT CLAVULANATE 875-125 MG PO TABS: 1.0000 | ORAL_TABLET | Freq: Two times a day (BID) | ORAL | 0 refills | Status: AC

## 2024-06-22 NOTE — Progress Notes (Signed)
 I have spent 5 minutes in review of e-visit questionnaire, review and updating patient chart, medical decision making and response to patient.   Elsie Velma Lunger, PA-C

## 2024-06-22 NOTE — Progress Notes (Signed)
 E-Visit for Sinus Problems  We are sorry that you are not feeling well.  Here is how we plan to help!  Based on what you have shared with me it looks like you have sinusitis.  Sinusitis is inflammation and infection in the sinus cavities of the head.  Based on your presentation I believe you most likely have Acute Bacterial Sinusitis.  This is an infection caused by bacteria and is treated with antibiotics. I have prescribed Augmentin  875mg /125mg  one tablet twice daily with food, for 7 days. You may use an oral decongestant such as Mucinex D or if you have glaucoma or high blood pressure use plain Mucinex. Saline nasal spray help and can safely be used as often as needed for congestion.  If you develop worsening sinus pain, fever or notice severe headache and vision changes, or if symptoms are not better after completion of antibiotic, please schedule an appointment with a health care provider.    With your throat pain -- on the pictures given, thankfully the tonsils themselves are not largely swollen. From the pictures given, the area on the tonsils looks like trapped drainage or potential very shallow ulceration instead of a pus pocket (like with strep throat). The antibiotic given for your overall bacterial URI will cover for bacterial cause of sore throat as well, but I do recommend starting a nasal steroid spray like Flonase to help with post-nasal drip that is likely causing the bulk of the throat pain and irritation.  Sinus infections are not as easily transmitted as other respiratory infection, however we still recommend that you avoid close contact with loved ones, especially the very young and elderly.  Remember to wash your hands thoroughly throughout the day as this is the number one way to prevent the spread of infection!  Home Care: Only take medications as instructed by your medical team. Complete the entire course of an antibiotic. Do not take these medications with alcohol. A steam or  ultrasonic humidifier can help congestion.  You can place a towel over your head and breathe in the steam from hot water coming from a faucet. Avoid close contacts especially the very young and the elderly. Cover your mouth when you cough or sneeze. Always remember to wash your hands.  Get Help Right Away If: You develop worsening fever or sinus pain. You develop a severe head ache or visual changes. Your symptoms persist after you have completed your treatment plan.  Make sure you Understand these instructions. Will watch your condition. Will get help right away if you are not doing well or get worse.  Thank you for choosing an e-visit.  Your e-visit answers were reviewed by a board certified advanced clinical practitioner to complete your personal care plan. Depending upon the condition, your plan could have included both over the counter or prescription medications.  Please review your pharmacy choice. Make sure the pharmacy is open so you can pick up prescription now. If there is a problem, you may contact your provider through [employer] and have the prescription routed to another pharmacy.  Your safety is important to us . If you have drug allergies check your prescription carefully.   For the next 24 hours you can use MyChart to ask questions about today's visit, request a non-urgent call back, or ask for a work or school excuse. You will get an email in the next two days asking about your experience. I hope that your e-visit has been valuable and will speed your recovery.

## 2024-06-22 NOTE — Progress Notes (Signed)
 Message sent to patient requesting further input regarding current symptoms. Awaiting patient response.

## 2024-08-02 ENCOUNTER — Encounter: Payer: BC Managed Care – PPO | Admitting: Family Medicine

## 2024-12-15 ENCOUNTER — Encounter: Admitting: Physician Assistant
